# Patient Record
Sex: Male | Born: 1970 | Race: White | Hispanic: No | Smoking: Current every day smoker
Health system: Southern US, Community
[De-identification: ages and names within clinical notes are randomized; demographics above are authoritative.]

## PROBLEM LIST (undated history)

## (undated) DIAGNOSIS — F329 Major depressive disorder, single episode, unspecified: Secondary | ICD-10-CM

## (undated) DIAGNOSIS — F419 Anxiety disorder, unspecified: Secondary | ICD-10-CM

## (undated) DIAGNOSIS — F1910 Other psychoactive substance abuse, uncomplicated: Secondary | ICD-10-CM

## (undated) DIAGNOSIS — F32A Depression, unspecified: Secondary | ICD-10-CM

## (undated) DIAGNOSIS — H9190 Unspecified hearing loss, unspecified ear: Secondary | ICD-10-CM

## (undated) HISTORY — PX: NOSE SURGERY: SHX723

## (undated) HISTORY — PX: ROTATOR CUFF REPAIR: SHX139

---
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
## 2019-02-18 ENCOUNTER — Ambulatory Visit (HOSPITAL_COMMUNITY)
Admission: EM | Admit: 2019-02-18 | Discharge: 2019-02-18 | Disposition: A | Discharge status: Home / Self Care | Payer: Medicare Other | Attending: Family Medicine | Admitting: Family Medicine

## 2019-02-18 ENCOUNTER — Encounter (HOSPITAL_COMMUNITY): Payer: Self-pay | Admitting: Family Medicine

## 2019-02-18 DIAGNOSIS — M25512 Pain in left shoulder: Secondary | ICD-10-CM | POA: Diagnosis not present

## 2019-02-18 DIAGNOSIS — M25551 Pain in right hip: Secondary | ICD-10-CM

## 2019-02-18 DIAGNOSIS — G8929 Other chronic pain: Secondary | ICD-10-CM

## 2019-02-18 MED ORDER — METHYLPREDNISOLONE ACETATE 40 MG/ML IJ SUSP
INTRAMUSCULAR | Status: AC
  Filled 2019-02-18: qty 2

## 2019-02-18 MED ORDER — METHYLPREDNISOLONE ACETATE 80 MG/ML IJ SUSP
40.0000 mg | Freq: Once | INTRAMUSCULAR | Status: AC
  Administered 2019-02-18: 40 mg via INTRA_ARTICULAR

## 2019-02-18 MED ORDER — METHYLPREDNISOLONE ACETATE 80 MG/ML IJ SUSP
40.0000 mg | Freq: Once | INTRAMUSCULAR | Status: AC
  Administered 2019-02-18: 40 mg via INTRAMUSCULAR

## 2019-02-18 NOTE — Discharge Instructions (Addendum)
If you need a shot in your right shoulder or right knee, you must wait a month

## 2019-02-18 NOTE — ED Provider Notes (Signed)
[facility]    CSN: [id number] Arrival date & time: 02/18/19  1421      History   Chief Complaint Chief Complaint  Patient presents with  . Back Pain  . Shoulder Pain  . Hip Pain    HPI Carl Skinner is a 48 y.o. male.   Initial [facility] visit for this 48 yo hearing impaired man with knee, shoulder and arm pain.  [place] registry shows patient has rec'd controlled substances from several cities in [place].  Patient periodically gets shots in his shoulders.  He would like a shot in each shoulder today as well as one in his hip.  I explained to him that we can only give him 2 shots today.  He would like to have a shot in his right hip and one in his left shoulder.     History reviewed. No pertinent past medical history.  There are no active problems to display for this patient.   Past Surgical History:  Procedure Laterality Date  . NOSE SURGERY         Home Medications    Prior to Admission medications   Medication Sig Start Date End Date Taking? Authorizing Provider  Dupilumab (DUPIXENT [place]) Inject into the skin.   Yes [provider]  QUEtiapine (SEROQUEL) 400 MG tablet Take 400 mg by mouth at bedtime.   Yes [provider]    Family History No family history on file.  Social History Social History   Tobacco Use  . Smoking status: Current Every Day Smoker  Substance Use Topics  . Alcohol use: Yes  . Drug use: Not on file     Allergies   Patient has no known allergies.   Review of Systems Review of Systems   Physical Exam Triage Vital Signs ED Triage Vitals  Enc Vitals Group     BP      Pulse      Resp      Temp      Temp src      SpO2      Weight      Height      Head Circumference      Peak Flow      Pain Score      Pain Loc      Pain Edu?      Excl. in [place]?    No data found.  Updated Vital Signs BP 122/70   Pulse 66   Temp 97.6 F (36.4 C)   Resp 18   SpO2 96%    Physical Exam Vitals signs  and nursing note reviewed. Exam conducted with a chaperone present.  Constitutional:      Appearance: Normal appearance. He is obese.  Eyes:     Conjunctiva/sclera: Conjunctivae normal.  Neck:     Musculoskeletal: Normal range of motion and neck supple.  Pulmonary:     Effort: Pulmonary effort is normal.  Musculoskeletal: Normal range of motion.        General: Tenderness and signs of injury present. No swelling or deformity.  Skin:    General: Skin is warm and dry.  Neurological:     General: No focal deficit present.     Mental Status: He is alert.  Psychiatric:        Mood and Affect: Mood normal.      UC Treatments / Results  Labs (all labs ordered are listed, but only abnormal results are displayed) Labs Reviewed - No data  to display  EKG   Radiology No results found.  Procedures Join Aspiration/Injection  Date/Time: 02/18/2019 3:15 PM Performed by: Elvina Sidle, MD Authorized by: Elvina Sidle, MD   Consent:    Consent obtained:  Verbal   Consent given by:  Patient   Risks discussed:  Pain   Alternatives discussed:  No treatment Location:    Location:  Shoulder   Shoulder:  L glenohumeral Anesthesia (see MAR for exact dosages):    Anesthesia method:  Topical application Procedure details:    Needle gauge:  22 G   Ultrasound guidance: no     Approach:  Posterior   Steroid injected: yes     Specimen collected: no   Post-procedure details:    Patient tolerance of procedure:  Tolerated well, no immediate complications   (including critical care time)  Medications Ordered in UC Medications  methylPREDNISolone acetate (DEPO-MEDROL) injection 40 mg (40 mg Intra-articular Given 02/18/19 1507)  methylPREDNISolone acetate (DEPO-MEDROL) injection 40 mg (40 mg Intramuscular Given 02/18/19 1507)  methylPREDNISolone acetate (DEPO-MEDROL) 40 MG/ML injection (has no administration in time range)    Initial Impression / Assessment and Plan / UC Course  I  have reviewed the triage vital signs and the nursing notes.  Pertinent labs & imaging results that were available during my care of the patient were reviewed by me and considered in my medical decision making (see chart for details).    Final Clinical Impressions(s) / UC Diagnoses   Final diagnoses:  Right hip pain  Chronic left shoulder pain     Discharge Instructions     If you need a shot in your right shoulder or right knee, you must wait a month      ED Prescriptions    None     I have reviewed the PDMP during this encounter.   Elvina Sidle, MD 02/18/19 [phone]
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
## 2019-04-30 ENCOUNTER — Ambulatory Visit (INDEPENDENT_AMBULATORY_CARE_PROVIDER_SITE_OTHER): Payer: Medicare Other

## 2019-04-30 ENCOUNTER — Ambulatory Visit (HOSPITAL_COMMUNITY)
Admission: EM | Admit: 2019-04-30 | Discharge: 2019-04-30 | Disposition: A | Discharge status: Home / Self Care | Payer: Medicare Other

## 2019-04-30 ENCOUNTER — Other Ambulatory Visit: Payer: Self-pay

## 2019-04-30 ENCOUNTER — Encounter (HOSPITAL_COMMUNITY): Payer: Self-pay

## 2019-04-30 DIAGNOSIS — M25512 Pain in left shoulder: Secondary | ICD-10-CM

## 2019-04-30 DIAGNOSIS — M25551 Pain in right hip: Secondary | ICD-10-CM | POA: Diagnosis not present

## 2019-04-30 MED ORDER — METHYLPREDNISOLONE ACETATE 80 MG/ML IJ SUSP
INTRAMUSCULAR | Status: AC
  Filled 2019-04-30: qty 1

## 2019-04-30 MED ORDER — METHYLPREDNISOLONE ACETATE 80 MG/ML IJ SUSP
80.0000 mg | Freq: Once | INTRAMUSCULAR | Status: AC
  Administered 2019-04-30: 80 mg via INTRAMUSCULAR

## 2019-04-30 MED ORDER — DICLOFENAC SODIUM 75 MG PO TBEC: 75.0000 mg | DELAYED_RELEASE_TABLET | Freq: Two times a day (BID) | ORAL | 0 refills | Status: DC
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
## 2019-06-14 ENCOUNTER — Other Ambulatory Visit: Payer: Self-pay

## 2019-06-14 ENCOUNTER — Encounter (HOSPITAL_BASED_OUTPATIENT_CLINIC_OR_DEPARTMENT_OTHER): Payer: Self-pay | Admitting: Orthopaedic Surgery

## 2019-06-17 ENCOUNTER — Other Ambulatory Visit (HOSPITAL_COMMUNITY)
Admission: RE | Admit: 2019-06-17 | Discharge: 2019-06-17 | Disposition: A | Discharge status: Home / Self Care | Payer: Medicare Other | Source: Ambulatory Visit | Attending: Orthopaedic Surgery | Admitting: Orthopaedic Surgery

## 2019-06-17 DIAGNOSIS — Z20822 Contact with and (suspected) exposure to covid-19: Secondary | ICD-10-CM | POA: Diagnosis not present

## 2019-06-17 DIAGNOSIS — Z01812 Encounter for preprocedural laboratory examination: Secondary | ICD-10-CM | POA: Insufficient documentation

## 2019-06-17 LAB — SARS CORONAVIRUS 2 (TAT 6-24 HRS): SARS Coronavirus 2: NEGATIVE

## 2019-06-18 NOTE — H&P (Addendum)
PREOPERATIVE H&P  Chief Complaint: LEFT SHOULDER OSTEOARTHRITIS, IMPINGEMENT SYNDROME, BICIPITAL TENDONITIS  HPI: Carl Skinner is a 49 y.o. male who is scheduled for LEFT SHOULDER ARTHROSCOPY WITH DEBRIDEMENT EXTENSIVE, DISTAL CLAVICULECTOMY, SUBACROMIAL DECOMPRESSION PARTIAL ACROMIOPLASTY WITH CORACOACROMIAL RELEASE AND BICEP TENODESIS.   Patient has a past medical history significant for hearing impairment.  He has had left shoulder pain that has been ongoing for years. He has tried and failed non-operative measures for quite some time, including injections, exercises and anti-inflammatories. He still has pain and frustrated by his left shoulder. He is left hand dominant.   His symptoms are rated as moderate to severe, and have been worsening.  This is significantly impairing activities of daily living.    Please see clinic note for further details on this patient's care.    He has elected for surgical management.   Past Medical History:  Diagnosis Date  . Anxiety   . Deaf   . Depression    Past Surgical History:  Procedure Laterality Date  . NOSE SURGERY     Social History   Socioeconomic History  . Marital status: Unknown    Spouse name: Not on file  . Number of children: Not on file  . Years of education: Not on file  . Highest education level: Not on file  Occupational History  . Not on file  Tobacco Use  . Smoking status: Current Every Day Smoker    Packs/day: 0.50    Types: Cigarettes  . Smokeless tobacco: Never Used  Substance and Sexual Activity  . Alcohol use: Yes  . Drug use: Never  . Sexual activity: Not on file  Other Topics Concern  . Not on file  Social History Narrative  . Not on file   Social Determinants of Health   Financial Resource Strain:   . Difficulty of Paying Living Expenses: Not on file  Food Insecurity:   . Worried About Programme researcher, broadcasting/film/video in the Last Year: Not on file  . Ran Out of Food in the Last Year: Not on file    Transportation Needs:   . Lack of Transportation (Medical): Not on file  . Lack of Transportation (Non-Medical): Not on file  Physical Activity:   . Days of Exercise per Week: Not on file  . Minutes of Exercise per Session: Not on file  Stress:   . Feeling of Stress : Not on file  Social Connections:   . Frequency of Communication with Friends and Family: Not on file  . Frequency of Social Gatherings with Friends and Family: Not on file  . Attends Religious Services: Not on file  . Active Member of Clubs or Organizations: Not on file  . Attends Banker Meetings: Not on file  . Marital Status: Not on file   Family History  Problem Relation Age of Onset  . Healthy Mother   . Healthy Father    Allergies  Allergen Reactions  . Shrimp [Shellfish Allergy] Nausea And Vomiting   Prior to Admission medications   Medication Sig Start Date End Date Taking? Authorizing Provider  DULoxetine (CYMBALTA) 20 MG capsule Take 20 mg by mouth daily.   Yes [provider]  diclofenac (VOLTAREN) 75 MG EC tablet Take 1 tablet (75 mg total) by mouth 2 (two) times daily. 04/30/19   Wallis Bamberg, PA-C    ROS: All other systems have been reviewed and were otherwise negative with the exception of those mentioned in the HPI and as  above.  Physical Exam: General: Alert, no acute distress Cardiovascular: No pedal edema Respiratory: No cyanosis, no use of accessory musculature GI: No organomegaly, abdomen is soft and non-tender Skin: No lesions in the area of chief complaint Neurologic: Sensation intact distally Psychiatric: Patient is competent for consent with normal mood and affect Lymphatic: No axillary or cervical lymphadenopathy  MUSCULOSKELETAL:  Left shoulder: Active forward elevation to 170, external rotation to 60. There is 4-/5 supraspinatus strength. Positive impingement. AC tenderness to palpation and O'Brien's.   Imaging: MRI which demonstrates significant  subacromial bursitis.  AC arthrosis with Type II acromion. There is fluid around the biceps and a possibility of a leading edge supraspinatus tear far anterior.   Assessment: LEFT SHOULDER OSTEOARTHRITIS, IMPINGEMENT SYNDROME, BICIPITAL TENDONITIS  Plan: Plan for Procedure(s): LEFT SHOULDER ARTHROSCOPY WITH DEBRIDEMENT EXTENSIVE, DISTAL CLAVICULECTOMY, SUBACROMIAL DECOMPRESSION PARTIAL ACROMIOPLASTY WITH CORACOACROMIAL RELEASE AND BICEP TENODESIS  The risks benefits and alternatives were discussed with the patient including but not limited to the risks of nonoperative treatment, versus surgical intervention including infection, bleeding, nerve injury,  blood clots, cardiopulmonary complications, morbidity, mortality, among others, and they were willing to proceed.   The patient acknowledged the explanation, agreed to proceed with the plan and consent was signed.   Operative Plan: Left shoulder scope with SAD, DCE, BT, and possible RCR Discharge Medications: Celebrex, Tylenol, Oxycodone, Zofran DVT Prophylaxis: None Physical Therapy: Outpatient PT Special Discharge needs: [place], PA-C  06/18/2019 7:04 AM

## 2019-06-20 ENCOUNTER — Ambulatory Visit (HOSPITAL_BASED_OUTPATIENT_CLINIC_OR_DEPARTMENT_OTHER): Payer: Medicare Other | Admitting: Anesthesiology

## 2019-06-20 ENCOUNTER — Encounter (HOSPITAL_BASED_OUTPATIENT_CLINIC_OR_DEPARTMENT_OTHER)
Admission: RE | Disposition: A | Discharge status: Home / Self Care | Payer: Self-pay | Source: Home / Self Care | Attending: Orthopaedic Surgery

## 2019-06-20 ENCOUNTER — Encounter (HOSPITAL_BASED_OUTPATIENT_CLINIC_OR_DEPARTMENT_OTHER): Payer: Self-pay | Admitting: Orthopaedic Surgery

## 2019-06-20 ENCOUNTER — Ambulatory Visit (HOSPITAL_BASED_OUTPATIENT_CLINIC_OR_DEPARTMENT_OTHER)
Admission: RE | Admit: 2019-06-20 | Discharge: 2019-06-20 | Disposition: A | Discharge status: Home / Self Care | Payer: Medicare Other | Attending: Orthopaedic Surgery | Admitting: Orthopaedic Surgery

## 2019-06-20 DIAGNOSIS — E669 Obesity, unspecified: Secondary | ICD-10-CM | POA: Insufficient documentation

## 2019-06-20 DIAGNOSIS — M7542 Impingement syndrome of left shoulder: Secondary | ICD-10-CM | POA: Diagnosis not present

## 2019-06-20 DIAGNOSIS — F329 Major depressive disorder, single episode, unspecified: Secondary | ICD-10-CM | POA: Diagnosis not present

## 2019-06-20 DIAGNOSIS — H9190 Unspecified hearing loss, unspecified ear: Secondary | ICD-10-CM | POA: Insufficient documentation

## 2019-06-20 DIAGNOSIS — Z6833 Body mass index (BMI) 33.0-33.9, adult: Secondary | ICD-10-CM | POA: Diagnosis not present

## 2019-06-20 DIAGNOSIS — F419 Anxiety disorder, unspecified: Secondary | ICD-10-CM | POA: Insufficient documentation

## 2019-06-20 DIAGNOSIS — M19012 Primary osteoarthritis, left shoulder: Secondary | ICD-10-CM | POA: Diagnosis present

## 2019-06-20 DIAGNOSIS — M7522 Bicipital tendinitis, left shoulder: Secondary | ICD-10-CM | POA: Insufficient documentation

## 2019-06-20 DIAGNOSIS — K219 Gastro-esophageal reflux disease without esophagitis: Secondary | ICD-10-CM | POA: Insufficient documentation

## 2019-06-20 DIAGNOSIS — F17210 Nicotine dependence, cigarettes, uncomplicated: Secondary | ICD-10-CM | POA: Insufficient documentation

## 2019-06-20 HISTORY — DX: Unspecified hearing loss, unspecified ear: H91.90

## 2019-06-20 HISTORY — PX: BICEPT TENODESIS: SHX5116

## 2019-06-20 HISTORY — DX: Anxiety disorder, unspecified: F41.9

## 2019-06-20 HISTORY — DX: Depression, unspecified: F32.A

## 2019-06-20 SURGERY — SHOULDER ARTHROSCOPY WITH SUBACROMIAL DECOMPRESSION AND DISTAL CLAVICLE EXCISION
Anesthesia: General | Site: Shoulder | Laterality: Left

## 2019-06-20 MED ORDER — DEXAMETHASONE SODIUM PHOSPHATE 10 MG/ML IJ SOLN
INTRAMUSCULAR | Status: DC | PRN
  Administered 2019-06-20: 10 mg via INTRAVENOUS

## 2019-06-20 MED ORDER — PROPOFOL 10 MG/ML IV BOLUS
INTRAVENOUS | Status: AC
  Filled 2019-06-20: qty 20

## 2019-06-20 MED ORDER — OXYCODONE HCL 5 MG PO TABS: ORAL_TABLET | ORAL | 0 refills | Status: AC

## 2019-06-20 MED ORDER — ONDANSETRON HCL 4 MG PO TABS: 4.0000 mg | ORAL_TABLET | Freq: Three times a day (TID) | ORAL | 1 refills | Status: AC | PRN

## 2019-06-20 MED ORDER — LACTATED RINGERS IV SOLN: INTRAVENOUS | Status: DC

## 2019-06-20 MED ORDER — FENTANYL CITRATE (PF) 100 MCG/2ML IJ SOLN
50.0000 ug | INTRAMUSCULAR | Status: DC | PRN
  Administered 2019-06-20: 50 ug via INTRAVENOUS

## 2019-06-20 MED ORDER — SODIUM CHLORIDE 0.9 % IR SOLN
Status: DC | PRN
  Administered 2019-06-20: 7500 mL

## 2019-06-20 MED ORDER — PROPOFOL 10 MG/ML IV BOLUS
INTRAVENOUS | Status: DC | PRN
  Administered 2019-06-20: 250 mg via INTRAVENOUS

## 2019-06-20 MED ORDER — CEFAZOLIN SODIUM-DEXTROSE 2-4 GM/100ML-% IV SOLN
INTRAVENOUS | Status: AC
  Filled 2019-06-20: qty 100

## 2019-06-20 MED ORDER — PHENYLEPHRINE HCL-NACL 10-0.9 MG/250ML-% IV SOLN
INTRAVENOUS | Status: DC | PRN
  Administered 2019-06-20: 25 ug/min via INTRAVENOUS

## 2019-06-20 MED ORDER — FENTANYL CITRATE (PF) 100 MCG/2ML IJ SOLN
INTRAMUSCULAR | Status: AC
  Filled 2019-06-20: qty 2

## 2019-06-20 MED ORDER — FENTANYL CITRATE (PF) 100 MCG/2ML IJ SOLN
25.0000 ug | INTRAMUSCULAR | Status: DC | PRN
  Administered 2019-06-20: 50 ug via INTRAVENOUS

## 2019-06-20 MED ORDER — ONDANSETRON HCL 4 MG/2ML IJ SOLN
INTRAMUSCULAR | Status: DC | PRN
  Administered 2019-06-20: 4 mg via INTRAVENOUS

## 2019-06-20 MED ORDER — CEFAZOLIN SODIUM-DEXTROSE 2-4 GM/100ML-% IV SOLN
2.0000 g | INTRAVENOUS | Status: AC
  Administered 2019-06-20: 2 g via INTRAVENOUS

## 2019-06-20 MED ORDER — DICLOFENAC SODIUM 75 MG PO TBEC: 75.0000 mg | DELAYED_RELEASE_TABLET | Freq: Two times a day (BID) | ORAL | 0 refills | Status: DC

## 2019-06-20 MED ORDER — LIDOCAINE 2% (20 MG/ML) 5 ML SYRINGE
INTRAMUSCULAR | Status: DC | PRN
  Administered 2019-06-20: 40 mg via INTRAVENOUS

## 2019-06-20 MED ORDER — ROPIVACAINE HCL 7.5 MG/ML IJ SOLN
INTRAMUSCULAR | Status: DC | PRN
  Administered 2019-06-20: 20 mL via PERINEURAL

## 2019-06-20 MED ORDER — ACETAMINOPHEN 500 MG PO TABS
1000.0000 mg | ORAL_TABLET | Freq: Once | ORAL | Status: AC
  Administered 2019-06-20: 1000 mg via ORAL

## 2019-06-20 MED ORDER — PHENYLEPHRINE 40 MCG/ML (10ML) SYRINGE FOR IV PUSH (FOR BLOOD PRESSURE SUPPORT)
PREFILLED_SYRINGE | INTRAVENOUS | Status: DC | PRN
  Administered 2019-06-20: 120 ug via INTRAVENOUS

## 2019-06-20 MED ORDER — CHLORHEXIDINE GLUCONATE 4 % EX LIQD: 60.0000 mL | Freq: Once | CUTANEOUS | Status: DC

## 2019-06-20 MED ORDER — MIDAZOLAM HCL 2 MG/2ML IJ SOLN
1.0000 mg | INTRAMUSCULAR | Status: DC | PRN
  Administered 2019-06-20: 2 mg via INTRAVENOUS

## 2019-06-20 MED ORDER — ACETAMINOPHEN 500 MG PO TABS: 1000.0000 mg | ORAL_TABLET | Freq: Three times a day (TID) | ORAL | 0 refills | Status: DC

## 2019-06-20 MED ORDER — ROCURONIUM BROMIDE 10 MG/ML (PF) SYRINGE
PREFILLED_SYRINGE | INTRAVENOUS | Status: DC | PRN
  Administered 2019-06-20: 50 mg via INTRAVENOUS

## 2019-06-20 MED ORDER — FENTANYL CITRATE (PF) 100 MCG/2ML IJ SOLN
INTRAMUSCULAR | Status: DC | PRN
  Administered 2019-06-20: 100 ug via INTRAVENOUS

## 2019-06-20 MED ORDER — SUGAMMADEX SODIUM 200 MG/2ML IV SOLN
INTRAVENOUS | Status: DC | PRN
  Administered 2019-06-20: 200 mg via INTRAVENOUS

## 2019-06-20 MED ORDER — ACETAMINOPHEN 500 MG PO TABS
ORAL_TABLET | ORAL | Status: AC
  Filled 2019-06-20: qty 2

## 2019-06-20 MED ORDER — MIDAZOLAM HCL 2 MG/2ML IJ SOLN
INTRAMUSCULAR | Status: AC
  Filled 2019-06-20: qty 2

## 2019-06-20 SURGICAL SUPPLY — 68 items
ANCHOR SUT 1.8 FBRTK KNTLS 2SU (Anchor) ×6 IMPLANT
BLADE EXCALIBUR 4.0MM X 13CM (MISCELLANEOUS) ×1
BLADE EXCALIBUR 4.0X13 (MISCELLANEOUS) ×2 IMPLANT
BURR OVAL 8 FLU 4.0MM X 13CM (MISCELLANEOUS) ×1
BURR OVAL 8 FLU 4.0X13 (MISCELLANEOUS) ×2 IMPLANT
CANNULA 5.75X71 LONG (CANNULA) IMPLANT
CANNULA PASSPORT 5 (CANNULA) IMPLANT
CANNULA PASSPORT 5CM (CANNULA)
CANNULA PASSPORT BUTTON 10-40 (CANNULA) ×3 IMPLANT
CANNULA TWIST IN 8.25X7CM (CANNULA) IMPLANT
CHLORAPREP W/TINT 26 (MISCELLANEOUS) ×3 IMPLANT
CLOSURE STERI-STRIP 1/2X4 (GAUZE/BANDAGES/DRESSINGS) ×1
CLSR STERI-STRIP ANTIMIC 1/2X4 (GAUZE/BANDAGES/DRESSINGS) ×2 IMPLANT
COVER WAND RF STERILE (DRAPES) IMPLANT
DECANTER SPIKE VIAL GLASS SM (MISCELLANEOUS) IMPLANT
DISSECTOR 3.5MM X 13CM CVD (MISCELLANEOUS) IMPLANT
DISSECTOR 4.0MMX13CM CVD (MISCELLANEOUS) IMPLANT
DRAPE IMP U-DRAPE 54X76 (DRAPES) ×3 IMPLANT
DRAPE INCISE IOBAN 66X45 STRL (DRAPES) IMPLANT
DRAPE SHOULDER BEACH CHAIR (DRAPES) ×3 IMPLANT
DRSG PAD ABDOMINAL 8X10 ST (GAUZE/BANDAGES/DRESSINGS) ×3 IMPLANT
DW OUTFLOW CASSETTE/TUBE SET (MISCELLANEOUS) ×3 IMPLANT
GAUZE SPONGE 4X4 12PLY STRL (GAUZE/BANDAGES/DRESSINGS) ×3 IMPLANT
GAUZE XEROFORM 1X8 LF (GAUZE/BANDAGES/DRESSINGS) IMPLANT
GLOVE BIO SURGEON STRL SZ 6.5 (GLOVE) ×2 IMPLANT
GLOVE BIO SURGEONS STRL SZ 6.5 (GLOVE) ×1
GLOVE BIOGEL PI IND STRL 6.5 (GLOVE) ×1 IMPLANT
GLOVE BIOGEL PI IND STRL 7.0 (GLOVE) ×2 IMPLANT
GLOVE BIOGEL PI IND STRL 8 (GLOVE) ×1 IMPLANT
GLOVE BIOGEL PI INDICATOR 6.5 (GLOVE) ×2
GLOVE BIOGEL PI INDICATOR 7.0 (GLOVE) ×4
GLOVE BIOGEL PI INDICATOR 8 (GLOVE) ×2
GLOVE ECLIPSE 6.5 STRL STRAW (GLOVE) ×6 IMPLANT
GLOVE ECLIPSE 8.0 STRL XLNG CF (GLOVE) ×3 IMPLANT
GOWN STRL REUS W/ TWL LRG LVL3 (GOWN DISPOSABLE) ×1 IMPLANT
GOWN STRL REUS W/TWL LRG LVL3 (GOWN DISPOSABLE) ×2
GOWN STRL REUS W/TWL XL LVL3 (GOWN DISPOSABLE) ×3 IMPLANT
KIT STABILIZATION SHOULDER (MISCELLANEOUS) ×3 IMPLANT
KIT STR SPEAR 1.8 FBRTK DISP (KITS) ×3 IMPLANT
LASSO 90 CVE QUICKPAS (DISPOSABLE) ×3 IMPLANT
LASSO CRESCENT QUICKPASS (SUTURE) IMPLANT
MANIFOLD NEPTUNE II (INSTRUMENTS) ×3 IMPLANT
NDL SAFETY ECLIPSE 18X1.5 (NEEDLE) ×1 IMPLANT
NEEDLE HYPO 18GX1.5 SHARP (NEEDLE) ×2
NEEDLE SCORPION MULTI FIRE (NEEDLE) IMPLANT
PACK ARTHROSCOPY DSU (CUSTOM PROCEDURE TRAY) ×3 IMPLANT
PACK BASIN DAY SURGERY FS (CUSTOM PROCEDURE TRAY) ×3 IMPLANT
PAD ORTHO SHOULDER 7X19 LRG (SOFTGOODS) ×3 IMPLANT
PORT APPOLLO RF 90DEGREE MULTI (SURGICAL WAND) ×3 IMPLANT
RESTRAINT HEAD UNIVERSAL NS (MISCELLANEOUS) ×3 IMPLANT
SHEET MEDIUM DRAPE 40X70 STRL (DRAPES) ×3 IMPLANT
SLEEVE SCD COMPRESS KNEE MED (MISCELLANEOUS) ×3 IMPLANT
SLING ARM FOAM STRAP LRG (SOFTGOODS) IMPLANT
SPONGE LAP 4X18 RFD (DISPOSABLE) IMPLANT
SUT FIBERWIRE #2 38 T-5 BLUE (SUTURE)
SUT MNCRL AB 4-0 PS2 18 (SUTURE) ×3 IMPLANT
SUT PDS AB 1 CT  36 (SUTURE) ×2
SUT PDS AB 1 CT 36 (SUTURE) ×1 IMPLANT
SUT TIGER TAPE 7 IN WHITE (SUTURE) IMPLANT
SUTURE FIBERWR #2 38 T-5 BLUE (SUTURE) IMPLANT
SUTURE TAPE TIGERLINK 1.3MM BL (SUTURE) IMPLANT
SUTURETAPE TIGERLINK 1.3MM BL (SUTURE)
SYR 5ML LL (SYRINGE) ×3 IMPLANT
TAPE FIBER 2MM 7IN #2 BLUE (SUTURE) IMPLANT
TOWEL GREEN STERILE FF (TOWEL DISPOSABLE) ×6 IMPLANT
TUBE CONNECTING 20'X1/4 (TUBING) ×1
TUBE CONNECTING 20X1/4 (TUBING) ×2 IMPLANT
TUBING ARTHROSCOPY IRRIG 16FT (MISCELLANEOUS) ×3 IMPLANT

## 2019-06-20 NOTE — Anesthesia Postprocedure Evaluation (Signed)
Anesthesia Post Note  Patient: Akshaj Besancon  Procedure(s) Performed: LEFT SHOULDER ARTHROSCOPY WITH DEBRIDEMENT EXTENSIVE, DISTAL CLAVICULECTOMY, SUBACROMIAL DECOMPRESSION PARTIAL ACROMIOPLASTY WITH CORACOACROMIAL RELEASE AND BICEP TENODESIS (Left Shoulder) BICEPS TENODESIS (Left Shoulder)     Patient location during evaluation: PACU Anesthesia Type: General Level of consciousness: awake and alert Pain management: pain level controlled Vital Signs Assessment: post-procedure vital signs reviewed and stable Respiratory status: spontaneous breathing, nonlabored ventilation and respiratory function stable Cardiovascular status: blood pressure returned to baseline and stable Postop Assessment: no apparent nausea or vomiting Anesthetic complications: no    Last Vitals:  Vitals:   06/20/19 0915 06/20/19 0930  BP: 113/80   Pulse: 68 71  Resp: 16 (!) 22  Temp:    SpO2: 97% 96%    Last Pain:  Vitals:   06/20/19 0928  TempSrc:   PainSc: 0-No pain                 Beryle Lathe
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
## 2019-06-20 NOTE — Anesthesia Procedure Notes (Signed)
Anesthesia Regional Block: Interscalene brachial plexus block   Pre-Anesthetic Checklist: ,, timeout performed, Correct Patient, Correct Site, Correct Laterality, Correct Procedure, Correct Position, site marked, Risks and benefits discussed,  Surgical consent,  Pre-op evaluation,  At surgeon's request and post-op pain management  Laterality: Left  Prep: chloraprep       Needles:  Injection technique: Single-shot  Needle Type: Echogenic Needle     Needle Length: 5cm  Needle Gauge: 21     Additional Needles:   Narrative:  Start time: 06/20/2019 7:14 AM End time: 06/20/2019 7:17 AM Injection made incrementally with aspirations every 5 mL.  Performed by: Personally  Anesthesiologist: Beryle Lathe, MD  Additional Notes: No pain on injection. No increased resistance to injection. Injection made in 5cc increments. Good needle visualization. Patient tolerated the procedure well.

## 2019-06-20 NOTE — Progress Notes (Signed)
AssistedDr. Brock with left, ultrasound guided, interscalene  block. Side rails up, monitors on throughout procedure. See vital signs in flow sheet. Tolerated Procedure well.  

## 2019-06-20 NOTE — Transfer of Care (Signed)
Immediate Anesthesia Transfer of Care Note  Patient: Carl Skinner  Procedure(s) Performed: LEFT SHOULDER ARTHROSCOPY WITH DEBRIDEMENT EXTENSIVE, DISTAL CLAVICULECTOMY, SUBACROMIAL DECOMPRESSION PARTIAL ACROMIOPLASTY WITH CORACOACROMIAL RELEASE AND BICEP TENODESIS (Left Shoulder) BICEPS TENODESIS (Left Shoulder)  Patient Location: PACU  Anesthesia Type:GA combined with regional for post-op pain  Level of Consciousness: drowsy  Airway & Oxygen Therapy: Patient Spontanous Breathing and Patient connected to nasal cannula oxygen  Post-op Assessment: Report given to RN and Post -op Vital signs reviewed and stable  Post vital signs: Reviewed and stable  Last Vitals:  Vitals Value Taken Time  BP 132/91 06/20/19 0853  Temp    Pulse 82 06/20/19 0854  Resp 39 06/20/19 0854  SpO2 97 % 06/20/19 0854  Vitals shown include unvalidated device data.  Last Pain:  Vitals:   06/20/19 0639  TempSrc: Temporal  PainSc: 5          Complications: No apparent anesthesia complications

## 2019-06-20 NOTE — Addendum Note (Signed)
Addendum  created 06/20/19 0953 by Elliot Dally, CRNA   Charge Capture section accepted

## 2019-06-20 NOTE — Op Note (Signed)
Orthopaedic Surgery Operative Note (CSN: [id number])  Carl Skinner  29-Dec-1970 Date of Surgery: 06/20/2019   Diagnoses:  Left shoulder AC arthrosis, impingement and biceps tendinitis  Procedure: Arthroscopic extensive debridement Arthroscopic subacromial decompression Arthroscopic biceps tenodesis Arthroscopic distal clavicle excision   Operative Finding Exam under anesthesia: Full motion no limitation Articular space: No loose bodies, capsule intact, anterior labral fraying noted  Chondral surfaces:Intact, no sign of chondral degeneration on the glenoid or humeral head Biceps: Significant inflammation in the tendon itself though the anchor looked slightly unstable as well. Subscapularis: Normal Superior Cuff: Normal Bursal side: Type II acromion converted to type I  Successful completion of the planned procedure.  Good fixation of the biceps with penetrating sutures to avoid pull-through.   Post-operative plan: The patient will be non-weightbearing in a sling for 4 weeks with therapy to start after first visit.  The patient will be discharged home.  DVT prophylaxis not indicated in ambulatory upper extremity patient without known risk factors.   Pain control with PRN pain medication preferring oral medicines.  Follow up plan will be scheduled in approximately 7 days for incision check and XR.  Post-Op Diagnosis: Same Surgeons:Primary: Hiram Gash, MD Assistants:Caroline McBane PA-C Location: [facility] OR ROOM 6 Anesthesia: General with Exparel interscalene block Antibiotics: Ancef 2 g Tourniquet time: None Estimated Blood Loss: Minimal Complications: None Specimens: None Implants: Implant Name Type Inv. Item Serial No. Manufacturer Lot No. LRB No. Used Action  ANCHOR SUT 1.8 FIBERTAK 2 SUT - [id number] Anchor ANCHOR SUT 1.8 FIBERTAK 2 SUT  [employer] [id number] Left 1 Implanted  ANCHOR SUT 1.8 FIBERTAK 2 SUT - [id number] Anchor ANCHOR SUT 1.8 FIBERTAK 2 SUT  [employer] [id number]  Left 1 Implanted    Indications for Surgery:   Carl Skinner is a 49 y.o. male with continued shoulder pain refractory to nonoperative measures for extended period of time.  The risks and benefits were explained at length including but not limited to continued pain, cuff failure, biceps tenodesis failure, stiffness, need for further surgery and infection.   Procedure:   Patient was correctly identified in the preoperative holding area and operative site marked.  Patient brought to OR and positioned beachchair on an Harts table ensuring that all bony prominences were padded and the head was in an appropriate location.  Anesthesia was induced and the operative shoulder was prepped and draped in the usual sterile fashion.  Timeout was called preincision.  A standard posterior viewing portal was made after localizing the portal with a spinal needle.  An anterior accessory portal was also made.  After clearing the articular space the camera was positioned in the subacromial space.  Findings above.    Extensive debridement was performed of the anterior interval tissue, labral fraying and the bursa.  Subacromial decompression: We made a lateral portal with spinal needle guidance. We then proceeded to debride bursal tissue extensively with a shaver and arthrocare device. At that point we continued to identify the borders of the acromion and identify the spur. We then carefully preserved the deltoid fascia and used a burr to convert the acromion to a Type 1 flat acromion without issue.  Biceps tenodesis: We marked the tendon and then performed a tenotomy and debridement of the stump in the articular space. We then identified the biceps tendon in its groove suprapec with the arthroscope in the lateral portal taking care to move from lateral to medial to avoid injury to the subscapularis. At that point we unroofed the  tendon itself and mobilized it. An accessory anterior portal was made in line with the tendon  and we grasped it from the anterior superior portal and worked from the accessory anterior portal. Two Fibertak 1.61mm knotless anchors were placed in the groove and the tendon was secured in a luggage loop style fashion with a pass of the limb of suture through the tendon using a scorpion device to avoid pull-through.  Repair was completed with good tension on the tendon.  Residual stump of the tendon was removed after being resected with a RF ablator.  Distal Clavicle resection:  The scope was placed in the subacromial space from the posterior portal.  A hemostat was placed through the anterior portal and we spread at the [facility] joint.  A burr was then inserted and 10 mm of distal clavicle was resected taking care to avoid damage to the capsule around the joint and avoiding overhanging bone posteriorly.      The incisions were closed with absorbable monocryl and steri strips.  A sterile dressing was placed along with a sling. The patient was awoken from general anesthesia and taken to the PACU in stable condition without complication.   Alfonse Alpers, PA-C, present and scrubbed throughout the case, critical for completion in a timely fashion, and for retraction, instrumentation, closure.

## 2019-06-20 NOTE — Interval H&P Note (Signed)
History and Physical Interval Note:  06/20/2019 7:22 AM  Carl Skinner  has presented today for surgery, with the diagnosis of LEFT SHOULDER OSTEOARTHRITIS, IMPINGEMENT SYNDROME, BICIPITAL TENDONITIS.  The various methods of treatment have been discussed with the patient and family. After consideration of risks, benefits and other options for treatment, the patient has consented to  Procedure(s): LEFT SHOULDER ARTHROSCOPY WITH DEBRIDEMENT EXTENSIVE, DISTAL CLAVICULECTOMY, SUBACROMIAL DECOMPRESSION PARTIAL ACROMIOPLASTY WITH CORACOACROMIAL RELEASE AND BICEP TENODESIS (Left) as a surgical intervention.  The patient's history has been reviewed, patient examined, no change in status, stable for surgery.  I have reviewed the patient's chart and labs.  Questions were answered to the patient's satisfaction.     Bjorn Pippin

## 2019-06-20 NOTE — Anesthesia Procedure Notes (Signed)
Procedure Name: Intubation Date/Time: 06/20/2019 7:37 AM Performed by: Elliot Dally, CRNA Pre-anesthesia Checklist: Patient identified, Emergency Drugs available, Suction available and Patient being monitored Patient Re-evaluated:Patient Re-evaluated prior to induction Oxygen Delivery Method: Circle System Utilized Preoxygenation: Pre-oxygenation with 100% oxygen Induction Type: IV induction Ventilation: Oral airway inserted - appropriate to patient size and Two handed mask ventilation required Laryngoscope Size: Miller and 3 Grade View: Grade I Tube type: Oral Tube size: 7.5 mm Number of attempts: 1 Airway Equipment and Method: Stylet and Oral airway Placement Confirmation: ETT inserted through vocal cords under direct vision,  positive ETCO2 and breath sounds checked- equal and bilateral Secured at: 22 cm Tube secured with: Tape Dental Injury: Teeth and Oropharynx as per pre-operative assessment

## 2019-06-20 NOTE — Discharge Instructions (Signed)
No Tylenol before 1:05pm today.  Post Anesthesia Home Care Instructions  Activity: Get plenty of rest for the remainder of the day. A responsible individual must stay with you for 24 hours following the procedure.  For the next 24 hours, DO NOT: -Drive a car -Advertising copywriter -Drink alcoholic beverages -Take any medication unless instructed by your physician -Make any legal decisions or sign important papers.  Meals: Start with liquid foods such as gelatin or soup. Progress to regular foods as tolerated. Avoid greasy, spicy, heavy foods. If nausea and/or vomiting occur, drink only clear liquids until the nausea and/or vomiting subsides. Call your physician if vomiting continues.  Special Instructions/Symptoms: Your throat may feel dry or sore from the anesthesia or the breathing tube placed in your throat during surgery. If this causes discomfort, gargle with warm salt water. The discomfort should disappear within 24 hours.  If you had a scopolamine patch placed behind your ear for the management of post- operative nausea and/or vomiting:  1. The medication in the patch is effective for 72 hours, after which it should be removed.  Wrap patch in a tissue and discard in the trash. Wash hands thoroughly with soap and water. 2. You may remove the patch earlier than 72 hours if you experience unpleasant side effects which may include dry mouth, dizziness or visual disturbances. 3. Avoid touching the patch. Wash your hands with soap and water after contact with the patch.    Regional Anesthesia Blocks  1. Numbness or the inability to move the "blocked" extremity may last from 3-48 hours after placement. The length of time depends on the medication injected and your individual response to the medication. If the numbness is not going away after 48 hours, call your surgeon.  2. The extremity that is blocked will need to be protected until the numbness is gone and the  Strength has returned.  Because you cannot feel it, you will need to take extra care to avoid injury. Because it may be weak, you may have difficulty moving it or using it. You may not know what position it is in without looking at it while the block is in effect.  3. For blocks in the legs and feet, returning to weight bearing and walking needs to be done carefully. You will need to wait until the numbness is entirely gone and the strength has returned. You should be able to move your leg and foot normally before you try and bear weight or walk. You will need someone to be with you when you first try to ensure you do not fall and possibly risk injury.  4. Bruising and tenderness at the needle site are common side effects and will resolve in a few days.  5. Persistent numbness or new problems with movement should be communicated to the surgeon or the [facility] [phone] [facility] [phone]).

## 2019-06-24 MED ORDER — HYDROCODONE-ACETAMINOPHEN 10-325 MG PO TABS
1.00 | ORAL_TABLET | ORAL | Status: DC
Start: ? — End: 2019-06-24

## 2019-07-03 ENCOUNTER — Other Ambulatory Visit: Payer: Self-pay

## 2019-07-03 ENCOUNTER — Ambulatory Visit (HOSPITAL_COMMUNITY)
Admission: EM | Admit: 2019-07-03 | Discharge: 2019-07-03 | Disposition: A | Discharge status: Home / Self Care | Payer: Medicare Other | Attending: Family Medicine | Admitting: Family Medicine

## 2019-07-03 ENCOUNTER — Encounter (HOSPITAL_COMMUNITY): Payer: Self-pay

## 2019-07-03 DIAGNOSIS — M25519 Pain in unspecified shoulder: Secondary | ICD-10-CM | POA: Diagnosis not present

## 2019-07-03 DIAGNOSIS — G8918 Other acute postprocedural pain: Secondary | ICD-10-CM

## 2019-07-03 MED ORDER — ACETAMINOPHEN 500 MG PO TABS: 1000.0000 mg | ORAL_TABLET | Freq: Three times a day (TID) | ORAL | 0 refills | Status: AC

## 2019-07-03 MED ORDER — HYDROCODONE-ACETAMINOPHEN 5-325 MG PO TABS: 1.0000 | ORAL_TABLET | Freq: Four times a day (QID) | ORAL | 0 refills | Status: DC | PRN

## 2019-07-03 NOTE — ED Notes (Signed)
Sign language interpreter asked permission to call patient's mother for him.  States technology is available, just needed to get permission from staff.  Permission granted.  Patient wants to call mother.

## 2019-07-03 NOTE — Discharge Instructions (Signed)
Please follow up with your surgeon for recheck and pain management as needed.  I do recommend establishing with a primary care provider for long term management of your blood thinning medication. Continue as previously prescribed.  Ace application, especially at the end of the day or after increased use.  Hydrocodone for breakthrough pain, otherwise use of tylenol scheduled to help with pain. May use Colace, over the counter, to help soften stools and prevent constipation.

## 2019-07-03 NOTE — ED Provider Notes (Signed)
[facility]    CSN: [id number] Arrival date & time: 07/03/19  1217      History   Chief Complaint Chief Complaint  Patient presents with  . refill on pain meds    HPI Carl Skinner is a 49 y.o. male.   Carl Skinner presents with complaints of left shoulder pain s/p left shoulder arthroscopy with Dr. Everardo Pacific on 3/4. He ended on with a hospital admission with left subclavian DVT and concern for cellulitis. Was started on eliquis and bactrim. Discharged 3/13. Ran out of his hydrocodone 2 days ago and states he still has a lot of pain with movement of left shoulder. No new numbness or tingling. Pain has generally improved, no worsening, but has not resolved. No new redness swelling or warmth. Doesn't have a follow up with ortho or with PCP set up. Doesn't have a PCP per patient.   ASL video interpreter used to collect history and physical exam.    ROS per HPI, negative if not otherwise mentioned.      Past Medical History:  Diagnosis Date  . Anxiety   . Deaf   . Depression     There are no problems to display for this patient.   Past Surgical History:  Procedure Laterality Date  . BICEPT TENODESIS Left 06/20/2019   Procedure: BICEPS TENODESIS;  Surgeon: Bjorn Pippin, MD;  Location: [facility];  Service: Orthopedics;  Laterality: Left;  . NOSE SURGERY    . ROTATOR CUFF REPAIR Left        Home Medications    Prior to Admission medications   Medication Sig Start Date End Date Taking? Authorizing Provider  apixaban (ELIQUIS) 5 MG TABS tablet Take 5 mg by mouth. 06/29/19  Yes [provider]  sulfamethoxazole-trimethoprim (BACTRIM DS) 800-160 MG tablet Take 160 tablets by mouth. 06/29/19 07/06/19 Yes [provider]  sulfamethoxazole-trimethoprim (BACTRIM DS) 800-160 MG tablet Take 1 tablet by mouth 2 (two) times daily.   Yes [provider]  acetaminophen (TYLENOL) 500 MG tablet Take 2 tablets (1,000 mg total) by  mouth every 8 (eight) hours for 14 days. 07/03/19 07/17/19  Carl Haber, NP  diclofenac (VOLTAREN) 75 MG EC tablet Take 1 tablet (75 mg total) by mouth 2 (two) times daily. 06/20/19   McBane, Jerald Kief, PA-C  DULoxetine (CYMBALTA) 20 MG capsule Take 20 mg by mouth daily.    [provider]  HYDROcodone-acetaminophen (NORCO/VICODIN) 5-325 MG tablet Take 1 tablet by mouth every 6 (six) hours as needed for moderate pain or severe pain. 07/03/19   Carl Haber, NP    Family History Family History  Problem Relation Age of Onset  . Healthy Mother   . Healthy Father     Social History Social History   Tobacco Use  . Smoking status: Current Every Day Smoker    Packs/day: 0.50    Types: Cigarettes  . Smokeless tobacco: Never Used  Substance Use Topics  . Alcohol use: Not Currently  . Drug use: Never     Allergies   Shrimp [shellfish allergy]   Review of Systems Review of Systems   Physical Exam Triage Vital Signs ED Triage Vitals  Enc Vitals Group     BP 07/03/19 1300 140/82     Pulse Rate 07/03/19 1300 66     Resp 07/03/19 1300 18     Temp 07/03/19 1300 97.7 F (36.5 C)     Temp Source 07/03/19 1300 Oral  SpO2 07/03/19 1300 96 %     Weight 07/03/19 1301 220 lb (99.8 kg)     Height 07/03/19 1301 5\' 10"  (1.778 m)     Head Circumference --      Peak Flow --      Pain Score 07/03/19 1300 10     Pain Loc --      Pain Edu? --      Excl. in GC? --    No data found.  Updated Vital Signs BP 140/82   Pulse 66   Temp 97.7 F (36.5 C) (Oral)   Resp 18   Ht 5\' 10"  (1.778 m)   Wt 220 lb (99.8 kg)   SpO2 96%   BMI 31.57 kg/m   Visual Acuity Right Eye Distance:   Left Eye Distance:   Bilateral Distance:    Right Eye Near:   Left Eye Near:    Bilateral Near:     Physical Exam Constitutional:      Appearance: He is well-developed.  Cardiovascular:     Rate and Rhythm: Normal rate.  Pulmonary:     Effort: Pulmonary effort is normal.    Musculoskeletal:     Comments: Well healing arthroscopy incisions to left proximal shoulder x2 noted without redness swelling or warmth; left bicep without significant redness or warmth; palpable radial pulse; patient is moving arm quite extensively, especially with signing, without noted guarding; cap refill < 2 seconds    Skin:    General: Skin is warm and dry.  Neurological:     Mental Status: He is alert and oriented to person, place, and time.      UC Treatments / Results  Labs (all labs ordered are listed, but only abnormal results are displayed) Labs Reviewed - No data to display  EKG   Radiology No results found.  Procedures Procedures (including critical care time)  Medications Ordered in UC Medications - No data to display  Initial Impression / Assessment and Plan / UC Course  I have reviewed the triage vital signs and the nursing notes.  Pertinent labs & imaging results that were available during my care of the patient were reviewed by me and considered in my medical decision making (see chart for details).     No worsening of pain, no new redness swelling or warmth. No obvious cellulitis present, still on bactrim and eliquis. Due to eliquis did not provide additional nsaids at this time, tylenol recommended at scheduled intervals. Additional 10 tabs of hydrocodone 5mg  provided, discussed with patient that he should be weaning off of these at this time, and to follow with his surgeon and/or PCP for recheck as needed and for management of his blood thinner. Return precautions provided. Patient verbalized understanding and agreeable to plan.   Final Clinical Impressions(s) / UC Diagnoses   Final diagnoses:  Postoperative pain, acute, shoulder     Discharge Instructions     Please follow up with your surgeon for recheck and pain management as needed.  I do recommend establishing with a primary care provider for long term management of your blood thinning  medication. Continue as previously prescribed.  Ace application, especially at the end of the day or after increased use.  Hydrocodone for breakthrough pain, otherwise use of tylenol scheduled to help with pain. May use Colace, over the counter, to help soften stools and prevent constipation.    ED Prescriptions    Medication Sig Dispense Auth. Provider   HYDROcodone-acetaminophen (NORCO/VICODIN) 5-325 MG tablet  Take 1 tablet by mouth every 6 (six) hours as needed for moderate pain or severe pain. 10 tablet Augusto Gamble B, NP   acetaminophen (TYLENOL) 500 MG tablet Take 2 tablets (1,000 mg total) by mouth every 8 (eight) hours for 14 days. 84 tablet Augusto Gamble B, NP     I have reviewed the PDMP during this encounter.   Zigmund Gottron, NP 07/03/19 1354

## 2019-07-03 NOTE — ED Triage Notes (Signed)
Pt states he had surgery for rotator cuff on left shoulder on 3/12 and he states he has 10/10 left shoulder pain still and wants a refill on pain meds. He said he is out of pain meds. Pt able to move extremity. Pt has 2+ left radial pulse, 5/5 bilat grip strength, limited ROM of left shoulder., extremity warm to touch, cap refill less than 3 sec.

## 2019-11-20 ENCOUNTER — Other Ambulatory Visit: Payer: Self-pay

## 2019-11-20 ENCOUNTER — Emergency Department (HOSPITAL_COMMUNITY)
Admission: EM | Admit: 2019-11-20 | Discharge: 2019-11-20 | Disposition: A | Discharge status: Home / Self Care | Payer: Medicare Other | Attending: Emergency Medicine | Admitting: Emergency Medicine

## 2019-11-20 ENCOUNTER — Encounter (HOSPITAL_COMMUNITY): Payer: Self-pay | Admitting: Emergency Medicine

## 2019-11-20 ENCOUNTER — Emergency Department (HOSPITAL_COMMUNITY): Payer: Medicare Other

## 2019-11-20 DIAGNOSIS — F1510 Other stimulant abuse, uncomplicated: Secondary | ICD-10-CM | POA: Diagnosis not present

## 2019-11-20 DIAGNOSIS — R112 Nausea with vomiting, unspecified: Secondary | ICD-10-CM | POA: Diagnosis not present

## 2019-11-20 DIAGNOSIS — M79672 Pain in left foot: Secondary | ICD-10-CM | POA: Diagnosis not present

## 2019-11-20 DIAGNOSIS — Z7901 Long term (current) use of anticoagulants: Secondary | ICD-10-CM | POA: Diagnosis not present

## 2019-11-20 DIAGNOSIS — Z79899 Other long term (current) drug therapy: Secondary | ICD-10-CM | POA: Diagnosis not present

## 2019-11-20 DIAGNOSIS — F17210 Nicotine dependence, cigarettes, uncomplicated: Secondary | ICD-10-CM | POA: Insufficient documentation

## 2019-11-20 LAB — LIPASE, BLOOD: Lipase: 23 U/L (ref 11–51)

## 2019-11-20 LAB — COMPREHENSIVE METABOLIC PANEL
ALT: 34 U/L (ref 0–44)
AST: 56 U/L — ABNORMAL HIGH (ref 15–41)
Albumin: 4.1 g/dL (ref 3.5–5.0)
Alkaline Phosphatase: 78 U/L (ref 38–126)
Anion gap: 9 (ref 5–15)
BUN: 32 mg/dL — ABNORMAL HIGH (ref 6–20)
CO2: 31 mmol/L (ref 22–32)
Calcium: 9 mg/dL (ref 8.9–10.3)
Chloride: 91 mmol/L — ABNORMAL LOW (ref 98–111)
Creatinine, Ser: 1.09 mg/dL (ref 0.61–1.24)
GFR calc Af Amer: >60 mL/min (ref >60)
GFR calc non Af Amer: >60 mL/min (ref >60)
Glucose, Bld: 101 mg/dL — ABNORMAL HIGH (ref 70–99)
Potassium: 3.5 mmol/L (ref 3.5–5.1)
Sodium: 131 mmol/L — ABNORMAL LOW (ref 135–145)
Total Bilirubin: 1.2 mg/dL (ref 0.3–1.2)
Total Protein: 7.3 g/dL (ref 6.5–8.1)

## 2019-11-20 LAB — RAPID URINE DRUG SCREEN, HOSP PERFORMED
Amphetamines: POSITIVE — AB
Barbiturates: NOT DETECTED
Benzodiazepines: NOT DETECTED
Cocaine: POSITIVE — AB
Opiates: NOT DETECTED
Tetrahydrocannabinol: NOT DETECTED

## 2019-11-20 LAB — URINALYSIS, ROUTINE W REFLEX MICROSCOPIC
Bilirubin Urine: NEGATIVE
Glucose, UA: NEGATIVE mg/dL
Hgb urine dipstick: NEGATIVE
Ketones, ur: NEGATIVE mg/dL
Leukocytes,Ua: NEGATIVE
Nitrite: NEGATIVE
Protein, ur: NEGATIVE mg/dL
Specific Gravity, Urine: 1.014 (ref 1.005–1.030)
pH: 5 (ref 5.0–8.0)

## 2019-11-20 LAB — CBC
HCT: 44.3 % (ref 39.0–52.0)
Hemoglobin: 14.8 g/dL (ref 13.0–17.0)
MCH: 29.1 pg (ref 26.0–34.0)
MCHC: 33.4 g/dL (ref 30.0–36.0)
MCV: 87 fL (ref 80.0–100.0)
Platelets: 228 10*3/uL (ref 150–400)
RBC: 5.09 MIL/uL (ref 4.22–5.81)
RDW: 13.2 % (ref 11.5–15.5)
WBC: 7 10*3/uL (ref 4.0–10.5)
nRBC: 0 % (ref 0.0–0.2)

## 2019-11-20 LAB — LACTIC ACID, PLASMA
Lactic Acid, Venous: 4 mmol/L (ref 0.5–1.9)
Lactic Acid, Venous: 2.6 mmol/L (ref 0.5–1.9)

## 2019-11-20 MED ORDER — ONDANSETRON HCL 4 MG/2ML IJ SOLN
4.0000 mg | Freq: Once | INTRAMUSCULAR | Status: AC
  Administered 2019-11-20: 4 mg via INTRAVENOUS
  Filled 2019-11-20: qty 2

## 2019-11-20 MED ORDER — SODIUM CHLORIDE 0.9 % IV BOLUS
1000.0000 mL | Freq: Once | INTRAVENOUS | Status: AC
  Administered 2019-11-20: 1000 mL via INTRAVENOUS

## 2019-11-20 MED ORDER — SODIUM CHLORIDE 0.9% FLUSH: 3.0000 mL | Freq: Once | INTRAVENOUS | Status: DC

## 2019-11-20 MED ORDER — LORAZEPAM 2 MG/ML IJ SOLN
1.0000 mg | Freq: Once | INTRAMUSCULAR | Status: AC
  Administered 2019-11-20: 1 mg via INTRAVENOUS
  Filled 2019-11-20: qty 1

## 2019-11-20 NOTE — Progress Notes (Signed)
CSW confirmed with [facility] that they could make accommodations for Pt.  CSW discussed placement option with Pt via note writing. Pt agreed to placement.  CSW updated EDP. TOC team will make transportation accomodation.

## 2019-11-20 NOTE — ED Notes (Signed)
ASL interpreter used.

## 2019-11-20 NOTE — Progress Notes (Signed)
Upon arrival at [facility], facility requested refax of notes. CSW refaxed notes to [phone] per request

## 2019-11-20 NOTE — Social Work (Signed)
CSW met with Pt at bedside with the assistance of interpreter system.  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 CSW has called [facility] in [place] in an effort to find detox placement. \Awaiting callback.

## 2019-11-20 NOTE — ED Notes (Signed)
Discharge instructions reviewed with pt. Pt verbalized understanding. ASL interp. used.  Pt discharged with [facility] transport to [facility]. CSW called facility to notify that pt is on the way.

## 2019-11-20 NOTE — ED Notes (Signed)
Pt transported to xray 

## 2019-11-20 NOTE — ED Notes (Signed)
Pt given food

## 2019-11-20 NOTE — Progress Notes (Signed)
CSW called [facility] and informed them that Pt would be transferring soon. CSW called [facility] for transportation.

## 2019-11-20 NOTE — Discharge Instructions (Signed)
[facility] [place] as discussed with the social work team for your meth abuse.  If you develop chest pain, difficulty breathing, vomiting or other new concerning symptom, recommend return to ER for reassessment.  Recommend follow-up with PCP as well.

## 2019-11-20 NOTE — ED Notes (Signed)
RN and Dr. Stevie Kern at bedside. This RN and the MD attempted to communicate plan of care with pt with ASL interpreter. Pt trying to sleeping. covering face, and not participating in conversation.

## 2019-11-20 NOTE — ED Provider Notes (Signed)
[facility] EMERGENCY DEPARTMENT Provider Note   CSN: [id number] Arrival date & time: 11/20/19  1506     History Chief Complaint  Patient presents with  . Emesis    Carl Skinner is a 49 y.o. male.  Death, utilized ASL interpreter.  States that over the last few days he has been having intermittent nausea and vomiting as well as occasional episode of loose stool.  Is vomited 5 times in total today, nonbloody nonbilious.  No blood in stool.  No abdominal pain.  Also states that he is trying to wean himself off meth.  Concerned couple days ago he may have stepped on something on his left foot and has been having left foot pain ever since.  HPI     Past Medical History:  Diagnosis Date  . Anxiety   . Deaf   . Depression     There are no problems to display for this patient.   Past Surgical History:  Procedure Laterality Date  . BICEPT TENODESIS Left 06/20/2019   Procedure: BICEPS TENODESIS;  Surgeon: Bjorn Pippin, MD;  Location: [facility];  Service: Orthopedics;  Laterality: Left;  . NOSE SURGERY    . ROTATOR CUFF REPAIR Left        Family History  Problem Relation Age of Onset  . Healthy Mother   . Healthy Father     Social History   Tobacco Use  . Smoking status: Current Every Day Smoker    Packs/day: 0.50    Types: Cigarettes  . Smokeless tobacco: Never Used  Substance Use Topics  . Alcohol use: Not Currently  . Drug use: Yes    Types: Methamphetamines    Home Medications Prior to Admission medications   Medication Sig Start Date End Date Taking? Authorizing Provider  apixaban (ELIQUIS) 5 MG TABS tablet Take 5 mg by mouth. 06/29/19   [provider]  diclofenac (VOLTAREN) 75 MG EC tablet Take 1 tablet (75 mg total) by mouth 2 (two) times daily. 06/20/19   McBane, Jerald Kief, PA-C  DULoxetine (CYMBALTA) 20 MG capsule Take 20 mg by mouth daily.    [provider]  HYDROcodone-acetaminophen  (NORCO/VICODIN) 5-325 MG tablet Take 1 tablet by mouth every 6 (six) hours as needed for moderate pain or severe pain. 07/03/19   Georgetta Haber, NP  sulfamethoxazole-trimethoprim (BACTRIM DS) 800-160 MG tablet Take 1 tablet by mouth 2 (two) times daily.    [provider]    Allergies    Shrimp [shellfish allergy]  Review of Systems   Review of Systems  Constitutional: Negative for chills and fever.  HENT: Negative for ear pain and sore throat.   Eyes: Negative for pain and visual disturbance.  Respiratory: Negative for cough and shortness of breath.   Cardiovascular: Negative for chest pain and palpitations.  Gastrointestinal: Negative for abdominal pain and vomiting.  Genitourinary: Negative for dysuria and hematuria.  Musculoskeletal: Positive for arthralgias. Negative for back pain.  Skin: Negative for color change and rash.  Neurological: Negative for seizures and syncope.  All other systems reviewed and are negative.   Physical Exam Updated Vital Signs BP 117/73   Pulse 68   Temp 98 F (36.7 C) (Oral)   Resp 16   Ht 5\' 10"  (1.778 m)   Wt 99.8 kg   SpO2 100%   BMI 31.57 kg/m   Physical Exam Vitals and nursing note reviewed.  Constitutional:      Appearance: He is  well-developed.  HENT:     Head: Normocephalic and atraumatic.  Eyes:     Conjunctiva/sclera: Conjunctivae normal.  Cardiovascular:     Rate and Rhythm: Normal rate and regular rhythm.     Heart sounds: No murmur heard.   Pulmonary:     Effort: Pulmonary effort is normal. No respiratory distress.     Breath sounds: Normal breath sounds.  Abdominal:     Palpations: Abdomen is soft.     Tenderness: There is no abdominal tenderness.  Musculoskeletal:     Cervical back: Neck supple.     Comments: L foot: there is mild swelling and tenderness to plantar surface of forefoot, no erythema or induration  Skin:    General: Skin is warm and dry.  Neurological:     Mental Status: He is alert.      ED Results / Procedures / Treatments   Labs (all labs ordered are listed, but only abnormal results are displayed) Labs Reviewed  COMPREHENSIVE METABOLIC PANEL - Abnormal; Notable for the following components:      Result Value   Sodium 131 (*)    Chloride 91 (*)    Glucose, Bld 101 (*)    BUN 32 (*)    AST 56 (*)    All other components within normal limits  RAPID URINE DRUG SCREEN, HOSP PERFORMED - Abnormal; Notable for the following components:   Cocaine POSITIVE (*)    Amphetamines POSITIVE (*)    All other components within normal limits  LACTIC ACID, PLASMA - Abnormal; Notable for the following components:   Lactic Acid, Venous 4.0 (*)    All other components within normal limits  LACTIC ACID, PLASMA - Abnormal; Notable for the following components:   Lactic Acid, Venous 2.6 (*)    All other components within normal limits  LIPASE, BLOOD  CBC  URINALYSIS, ROUTINE W REFLEX MICROSCOPIC    EKG EKG Interpretation  Date/Time:  Wednesday November 20 2019 15:33:12 EDT Ventricular Rate:  45 PR Interval:  146 QRS Duration: 102 QT Interval:  520 QTC Calculation: 449 R Axis:   50 Text Interpretation: Sinus bradycardia Otherwise normal ECG Confirmed by Kennis Carina [phone]) on 11/20/2019 3:46:14 PM   Radiology DG Foot Complete Left  Result Date: 11/20/2019 CLINICAL DATA:  Left plantar foot pain. EXAM: LEFT FOOT - COMPLETE 3+ VIEW COMPARISON:  None. FINDINGS: There is no evidence of fracture or dislocation. Mild joint space narrowing and marginal osteophyte formation along the lateral margin of the first MTP joint. Joint spaces are otherwise preserved. Soft tissues are unremarkable. No radiopaque foreign body. IMPRESSION: 1. No acute fracture or radiopaque foreign body of the left foot. 2. Mild degenerative changes of the first MTP joint. Electronically Signed   By: Duanne Guess D.O.   On: 11/20/2019 16:40    Procedures Procedures (including critical care  time)  Medications Ordered in ED Medications  sodium chloride flush (NS) 0.9 % injection 3 mL (has no administration in time range)  LORazepam (ATIVAN) injection 1 mg (1 mg Intravenous Given 11/20/19 1724)  ondansetron (ZOFRAN) injection 4 mg (4 mg Intravenous Given 11/20/19 1722)  sodium chloride 0.9 % bolus 1,000 mL (0 mLs Intravenous Stopped 11/20/19 1831)  sodium chloride 0.9 % bolus 1,000 mL (0 mLs Intravenous Stopped 11/20/19 2138)    ED Course  I have reviewed the triage vital signs and the nursing notes.  Pertinent labs & imaging results that were available during my care of the patient were reviewed by  me and considered in my medical decision making (see chart for details).    MDM Rules/Calculators/A&P                         49 year old male presents to ER with 3 concerns. Regarding foot pain, no obvious deformity on exam, no obvious infection, obtained x-ray which is negative for fracture, foreign body. Regarding nausea and vomiting, obtain labs, patient was provided with antiemetics, fluids. Labs noted for mild hyponatremia but otherwise normal electrolytes, lactic acid initially was elevated. UA negative, UDS positive for benzos and meth. After patient received meds, fluids, his GI symptoms have completely resolved. His abdomen remained soft on repeat exams. Vital signs remained stable. Repeat lactate was improved. Given his overall clinical picture with reassuring exam and vital signs, believe patient can be discharged. Patient also requested rehab. Case management evaluated patient, able to secure place at [facility]. Patient will be transported to [facility].    After the discussed management above, the patient was determined to be safe for discharge.  The patient was in agreement with this plan and all questions regarding their care were answered.  ED return precautions were discussed and the patient will return to the ED with any significant worsening of condition.  Final  Clinical Impression(s) / ED Diagnoses Final diagnoses:  Nausea and vomiting, intractability of vomiting not specified, unspecified vomiting type  Methamphetamine abuse [facility])    Rx / DC Orders ED Discharge Orders    None       Milagros Loll, MD 11/20/19 2353

## 2019-11-20 NOTE — ED Triage Notes (Signed)
Pt brought to ED by [facility] from [facility] for c/o nausea and vomiting for the past 3 days, pt is addicted to Meth and is trying to quick by him self. Possible withdraw. SBP by EMS 90, HR 56, R18, SPO2 100% RA. cbg 158.

## 2020-05-29 ENCOUNTER — Ambulatory Visit (HOSPITAL_COMMUNITY)
Admission: EM | Admit: 2020-05-29 | Discharge: 2020-05-29 | Disposition: A | Discharge status: Home / Self Care | Payer: Medicare Other | Attending: Student | Admitting: Student

## 2020-05-29 ENCOUNTER — Other Ambulatory Visit: Payer: Self-pay

## 2020-05-29 ENCOUNTER — Encounter (HOSPITAL_COMMUNITY): Payer: Self-pay | Admitting: Emergency Medicine

## 2020-05-29 DIAGNOSIS — Z86718 Personal history of other venous thrombosis and embolism: Secondary | ICD-10-CM

## 2020-05-29 DIAGNOSIS — F329 Major depressive disorder, single episode, unspecified: Secondary | ICD-10-CM

## 2020-05-29 DIAGNOSIS — R0989 Other specified symptoms and signs involving the circulatory and respiratory systems: Secondary | ICD-10-CM

## 2020-05-29 DIAGNOSIS — L600 Ingrowing nail: Secondary | ICD-10-CM

## 2020-05-29 DIAGNOSIS — Z760 Encounter for issue of repeat prescription: Secondary | ICD-10-CM | POA: Diagnosis not present

## 2020-05-29 DIAGNOSIS — H9193 Unspecified hearing loss, bilateral: Secondary | ICD-10-CM

## 2020-05-29 MED ORDER — CITALOPRAM HYDROBROMIDE 20 MG PO TABS: 20.0000 mg | ORAL_TABLET | Freq: Every day | ORAL | 0 refills | Status: DC

## 2020-05-29 NOTE — ED Notes (Signed)
Pt states he cannot do ultrasound today but he will go to ED per provider advice

## 2020-05-29 NOTE — ED Triage Notes (Signed)
Pt presents for medication refill of duloxetine and toe pain xs 1-2 months.   Pt states is taking 60mg  of duloxetine daily now.

## 2020-05-29 NOTE — Discharge Instructions (Addendum)
-  I refilled your Celexa. Continue this as directed for depression/anxiety.  -Please schedule an appointment with podiatrist for management of ingrown toenail (information below) -If you develop chest pain, shortness of breath, etc- head straight to ED. If you are not able to have the ultrasound of your left arm performed today, you MUST head to the ED for evaluation of DVT. An untreated clot in a blood vessel can lead to death.

## 2020-05-29 NOTE — ED Provider Notes (Addendum)
[facility]    CSN: [id number] Arrival date & time: 05/29/20  1320      History   Chief Complaint Chief Complaint  Patient presents with  . Medication Refill  . Toe Pain    HPI Carl Skinner is a 50 y.o. male presenting with multiple complaints. This patient is deaf and uses ASL for communication; interpreter used today. History of anxiety and depression controlled on celexa.  -this patient has a history of DVT 1 year ago in his left upper extremity. He states it travelled into his neck. He was put on eliquis for this at the time with resolution of DVT. Today he presents with few days of venous tenderness and firmness, left lower arm. Requesting refill on eliquis. He has not taken eliquis in about 1 year. He denies shortness of breath, chest pain, leg swelling, recent travel/immobilization.  -presenting with ingrown toenail on right foot for few months. Has been unable to resolve this on his own. Requesting removal today. -States he needs a refill of celexa for his depression/anxiety. Has been on this for years with good control. Denies SI/HI. -Patient with history of chronic right shoulder pain, takes hydrocodone for pain control and states he needs a refill on this.   HPI  Past Medical History:  Diagnosis Date  . Anxiety   . Deaf   . Depression     There are no problems to display for this patient.   Past Surgical History:  Procedure Laterality Date  . BICEPT TENODESIS Left 06/20/2019   Procedure: BICEPS TENODESIS;  Surgeon: Bjorn Pippin, MD;  Location: [facility];  Service: Orthopedics;  Laterality: Left;  . NOSE SURGERY    . ROTATOR CUFF REPAIR Left        Home Medications    Prior to Admission medications   Medication Sig Start Date End Date Taking? Authorizing Provider  citalopram (CELEXA) 20 MG tablet Take 1 tablet (20 mg total) by mouth daily. 05/29/20 06/28/20 Yes Rhys Martini, PA-C  apixaban (ELIQUIS) 5 MG TABS tablet Take 5 mg  by mouth. 06/29/19   [provider]  diclofenac (VOLTAREN) 75 MG EC tablet Take 1 tablet (75 mg total) by mouth 2 (two) times daily. 06/20/19   McBane, Jerald Kief, PA-C  DULoxetine (CYMBALTA) 20 MG capsule Take 20 mg by mouth daily.    [provider]  HYDROcodone-acetaminophen (NORCO/VICODIN) 5-325 MG tablet Take 1 tablet by mouth every 6 (six) hours as needed for moderate pain or severe pain. 07/03/19   Georgetta Haber, NP  sulfamethoxazole-trimethoprim (BACTRIM DS) 800-160 MG tablet Take 1 tablet by mouth 2 (two) times daily.    [provider]    Family History Family History  Problem Relation Age of Onset  . Healthy Mother   . Healthy Father     Social History Social History   Tobacco Use  . Smoking status: Current Every Day Smoker    Packs/day: 0.50    Types: Cigarettes  . Smokeless tobacco: Never Used  Substance Use Topics  . Alcohol use: Not Currently  . Drug use: Yes    Types: Methamphetamines     Allergies   Shrimp [shellfish allergy]   Review of Systems Review of Systems  Musculoskeletal:       Right shoulder pain  Venous tenderness left arm  Ingrown toenail right foot   Psychiatric/Behavioral:       Depression and anxiety  All other systems reviewed and are negative.  Physical Exam Triage Vital Signs ED Triage Vitals  Enc Vitals Group     BP 05/29/20 1357 112/75     Pulse Rate 05/29/20 1357 61     Resp 05/29/20 1357 19     Temp 05/29/20 1357 97.7 F (36.5 C)     Temp Source 05/29/20 1357 Oral     SpO2 05/29/20 1357 96 %     Weight --      Height --      Head Circumference --      Peak Flow --      Pain Score 05/29/20 1353 6     Pain Loc --      Pain Edu? --      Excl. in GC? --    No data found.  Updated Vital Signs BP 112/75 (BP Location: Right Arm)   Pulse 61   Temp 97.7 F (36.5 C) (Oral)   Resp 19   SpO2 96%   Visual Acuity Right Eye Distance:   Left Eye Distance:   Bilateral Distance:    Right  Eye Near:   Left Eye Near:    Bilateral Near:     Physical Exam Vitals reviewed.  Constitutional:      Appearance: Normal appearance.  Cardiovascular:     Rate and Rhythm: Normal rate and regular rhythm.     Pulses:          Radial pulses are 2+ on the right side and 2+ on the left side.     Heart sounds: Normal heart sounds.     Comments: Negative homan sign  Left arm with multiple areas of tenderness and firmness along venous system.  Pulmonary:     Effort: Pulmonary effort is normal.     Breath sounds: Normal breath sounds. No wheezing, rhonchi or rales.  Abdominal:     General: Abdomen is flat.     Tenderness: There is no abdominal tenderness. There is no guarding or rebound.  Musculoskeletal:     Right lower leg: No edema.     Left lower leg: No edema.  Skin:    Comments: R middle toenail mildly ingrown medially. No surrounding erythema or discharge.   Neurological:     General: No focal deficit present.     Mental Status: He is alert and oriented to person, place, and time.  Psychiatric:        Mood and Affect: Mood normal.        Behavior: Behavior normal.        Thought Content: Thought content normal.        Judgment: Judgment normal.      UC Treatments / Results  Labs (all labs ordered are listed, but only abnormal results are displayed) Labs Reviewed - No data to display  EKG   Radiology No results found.  Procedures Procedures (including critical care time)  Medications Ordered in UC Medications - No data to display  Initial Impression / Assessment and Plan / UC Course  I have reviewed the triage vital signs and the nursing notes.  Pertinent labs & imaging results that were available during my care of the patient were reviewed by me and considered in my medical decision making (see chart for details).     This patient is a 50 year old man presenting with multiple complaints, including possible DVT. He has a history of DVT that he was treated  for using Eliquis 06/2019. On exam, vein of left arm is indeed tender and tense;  I am concerned for DVT.   We attempted to schedule ultrasound outpatient for evaluation of this patient's possible DVT; however given time of day, we were unable to accomplish this. Today he is  afebrile nontachycardic nontachypneic, oxygenating well on room air, denies shortness of breath. I have a low suspicion of PE at this time. We discussed that it is my medical opinion that this patient should head to the ED for further evaluation and management of possible DVT. He verbalizes that he will not head to the ED and that he will manage this outpatient. I stressed that he could die if he does not head to the ED for further evaluation. He verbalizes understanding.   I refilled this patient's Celexa today, which he has been on for years for depression.   (*05/30/2020 update patient called using interpreter and is now saying he actually needs duloxetine 60mg . Sent this. He stated multiple times he needs 60mg  despite chart saying he takes 20mg . I reiterated he must go to ED for DVT evaluation or he could die*.)  Patient requesting refill of Hydrocodone for management of chronic shoulder pain; I discussed that he should f/u with orthopedist for this, as we do not treat chronic pain at this clinic.   This patient also presenting with ingrown toenail. Podiatry information provided; he will schedule an appointment with them.   We discussed that given his multiple chronic and acute medical conditions, I strongly advise establishing care with PCP. He states he has an appointment to establish care with PCP in 1 month. Encouraged him to keep this appointment.   Using language line, spent over 60 minutes obtaining H&P, performing physical, discussing results, treatment plan and plan for follow-up with patient. Patient agrees with plan.   This chart was dictated using voice recognition software, Dragon. Despite the best efforts of this  provider to proofread and correct errors, errors may still occur which can change documentation meaning.   Final Clinical Impressions(s) / UC Diagnoses   Final diagnoses:  Medication refill  History of DVT in adulthood  Major depressive disorder with current active episode, unspecified depression episode severity, unspecified whether recurrent  Ingrown toenail of right foot     Discharge Instructions     -I refilled your Celexa. Continue this as directed for depression/anxiety.  -Please schedule an appointment with podiatrist for management of ingrown toenail (information below) -If you develop chest pain, shortness of breath, etc- head straight to ED. If you are not able to have the ultrasound of your left arm performed today, you MUST head to the ED for evaluation of DVT. An untreated clot in a blood vessel can lead to death.    ED Prescriptions    Medication Sig Dispense Auth. Provider   citalopram (CELEXA) 20 MG tablet Take 1 tablet (20 mg total) by mouth daily. 30 tablet , PA-C     PDMP not reviewed this encounter.   , PA-C 05/29/20 1554    Rhys Martini, PA-C 05/30/20 1259

## 2020-05-30 ENCOUNTER — Telehealth (HOSPITAL_COMMUNITY): Payer: Self-pay | Admitting: Student

## 2020-05-30 ENCOUNTER — Telehealth (HOSPITAL_COMMUNITY): Payer: Self-pay

## 2020-05-30 MED ORDER — DULOXETINE HCL 60 MG PO CPEP: 60.0000 mg | ORAL_CAPSULE | Freq: Every day | ORAL | 0 refills | Status: DC

## 2020-05-30 NOTE — Telephone Encounter (Signed)
Patient calls stating he actually requires refill of duloxetine, not celexa. Sent.

## 2020-05-30 NOTE — Telephone Encounter (Signed)
Pt called with use of ASL interpreter requesting correction/new refill of duloxetine 60mg  per day. Rx was sent by L. , who also advised to re-iterated to pt that he needs to go to ED STAT for u/s of arm for likely DVT. Pt states understanding of same, but states he will attempt to go to PMD next week for eval of DVT symptoms.

## 2020-06-27 ENCOUNTER — Encounter (HOSPITAL_COMMUNITY): Payer: Self-pay

## 2020-06-27 ENCOUNTER — Ambulatory Visit (HOSPITAL_COMMUNITY)
Admission: EM | Admit: 2020-06-27 | Discharge: 2020-06-27 | Disposition: A | Discharge status: Home / Self Care | Payer: Medicare Other | Attending: Family Medicine | Admitting: Family Medicine

## 2020-06-27 DIAGNOSIS — F332 Major depressive disorder, recurrent severe without psychotic features: Secondary | ICD-10-CM | POA: Diagnosis not present

## 2020-06-27 MED ORDER — DULOXETINE HCL 60 MG PO CPEP: 60.0000 mg | ORAL_CAPSULE | Freq: Every day | ORAL | 0 refills | Status: DC

## 2020-06-27 NOTE — ED Triage Notes (Signed)
Pt present for medication refill on duloxetine. Pt states is taking 60 mg but would like his dosage changed from 60 mg to higher. Pt run out of medication today. He has an appointment with his PCP on 07/13/20

## 2020-06-27 NOTE — ED Provider Notes (Signed)
[facility]    CSN: [id number] Arrival date & time: 06/27/20  1637      History   Chief Complaint Chief Complaint  Patient presents with  . Medication Refill    HPI Carl Skinner is a 50 y.o. male.   Medical interpreter used to facilitate visit today using ASL with patient's consent.  Patient with history of polysubstance abuse, severe depression and anxiety presenting for refill on his duloxetine but also wanting to discuss adding medication or increasing his dose as he is still having issues with angry outbursts, OCD behaviors.  He denies current suicidal or homicidal ideation, missed doses or inappropriate use of his medication.  He has a follow-up scheduled with his PCP in about 2 weeks but does not want to wait that long.     Past Medical History:  Diagnosis Date  . Anxiety   . Deaf   . Depression     There are no problems to display for this patient.   Past Surgical History:  Procedure Laterality Date  . BICEPT TENODESIS Left 06/20/2019   Procedure: BICEPS TENODESIS;  Surgeon: Bjorn Pippin, MD;  Location: [facility];  Service: Orthopedics;  Laterality: Left;  . NOSE SURGERY    . ROTATOR CUFF REPAIR Left        Home Medications    Prior to Admission medications   Medication Sig Start Date End Date Taking? Authorizing Provider  apixaban (ELIQUIS) 5 MG TABS tablet Take 5 mg by mouth. 06/29/19   [provider]  citalopram (CELEXA) 20 MG tablet Take 1 tablet (20 mg total) by mouth daily. 05/29/20 06/28/20  Rhys Martini, PA-C  diclofenac (VOLTAREN) 75 MG EC tablet Take 1 tablet (75 mg total) by mouth 2 (two) times daily. 06/20/19   McBane, Jerald Kief, PA-C  DULoxetine (CYMBALTA) 60 MG capsule Take 1 capsule (60 mg total) by mouth daily. 06/27/20 07/27/20  Particia Nearing, PA-C  HYDROcodone-acetaminophen (NORCO/VICODIN) 5-325 MG tablet Take 1 tablet by mouth every 6 (six) hours as needed for moderate pain or severe pain.  07/03/19   Georgetta Haber, NP  sulfamethoxazole-trimethoprim (BACTRIM DS) 800-160 MG tablet Take 1 tablet by mouth 2 (two) times daily.    [provider]    Family History Family History  Problem Relation Age of Onset  . Healthy Mother   . Healthy Father     Social History Social History   Tobacco Use  . Smoking status: Current Every Day Smoker    Packs/day: 0.50    Types: Cigarettes  . Smokeless tobacco: Never Used  Substance Use Topics  . Alcohol use: Not Currently  . Drug use: Yes    Types: Methamphetamines     Allergies   Shrimp [shellfish allergy]   Review of Systems Review of Systems Per HPI  Physical Exam Triage Vital Signs ED Triage Vitals  Enc Vitals Group     BP 06/27/20 1700 126/86     Pulse Rate 06/27/20 1700 (!) 55     Resp 06/27/20 1700 18     Temp 06/27/20 1700 98.9 F (37.2 C)     Temp Source 06/27/20 1700 Oral     SpO2 06/27/20 1700 98 %     Weight --      Height --      Head Circumference --      Peak Flow --      Pain Score 06/27/20 1659 0     Pain Loc --  Pain Edu? --      Excl. in GC? --    No data found.  Updated Vital Signs BP 126/86 (BP Location: Right Arm)   Pulse (!) 55   Temp 98.9 F (37.2 C) (Oral)   Resp 18   SpO2 98%   Visual Acuity Right Eye Distance:   Left Eye Distance:   Bilateral Distance:    Right Eye Near:   Left Eye Near:    Bilateral Near:     Physical Exam Vitals and nursing note reviewed.  Constitutional:      Appearance: Normal appearance.  HENT:     Head: Atraumatic.  Eyes:     Extraocular Movements: Extraocular movements intact.     Conjunctiva/sclera: Conjunctivae normal.  Cardiovascular:     Rate and Rhythm: Normal rate and regular rhythm.  Pulmonary:     Effort: Pulmonary effort is normal.     Breath sounds: Normal breath sounds.  Musculoskeletal:        General: Normal range of motion.     Cervical back: Normal range of motion and neck supple.  Skin:    General:  Skin is warm and dry.  Neurological:     Mental Status: Mental status is at baseline.     Motor: No weakness.  Psychiatric:        Mood and Affect: Mood normal.        Thought Content: Thought content normal.        Judgment: Judgment normal.      UC Treatments / Results  Labs (all labs ordered are listed, but only abnormal results are displayed) Labs Reviewed - No data to display  EKG   Radiology No results found.  Procedures Procedures (including critical care time)  Medications Ordered in UC Medications - No data to display  Initial Impression / Assessment and Plan / UC Course  I have reviewed the triage vital signs and the nursing notes.  Pertinent labs & imaging results that were available during my care of the patient were reviewed by me and considered in my medical decision making (see chart for details).     Discussed at length with patient that we would refill his current medication at current dose, but he will need to follow-up with PCP for any dose changes or additional medications.  I did offer hydroxyzine but he states he has used this in the past for his anxiety without any benefit.  Did also provide the behavioral health urgent care information in case he needed to be seen by specialist sooner than his primary care visit, particularly if he feels he is a danger to himself or others at any time.  He is not currently having any suicidal or homicidal ideation.  Final Clinical Impressions(s) / UC Diagnoses   Final diagnoses:  Severe episode of recurrent major depressive disorder, without psychotic features [facility])     Discharge Instructions     [facility]  Phone: [phone]  Address: [address] [place], [place] [address]  Hours: Open 24/7, No appointment required.    ED Prescriptions    Medication Sig Dispense Auth. Provider   DULoxetine (CYMBALTA) 60 MG capsule Take 1 capsule (60 mg total) by mouth daily. 30  capsule Particia Nearing, [place]     PDMP not reviewed this encounter.   Particia Nearing, [place] 06/27/20 1718

## 2020-06-27 NOTE — Discharge Instructions (Signed)
[facility]  Phone: [phone]  Address: [address] [place], [place] [address]  Hours: Open 24/7, No appointment required.

## 2020-07-06 ENCOUNTER — Ambulatory Visit (HOSPITAL_COMMUNITY)
Admission: EM | Admit: 2020-07-06 | Discharge: 2020-07-06 | Disposition: A | Discharge status: Home / Self Care | Payer: Medicare Other | Attending: Emergency Medicine | Admitting: Emergency Medicine

## 2020-07-06 ENCOUNTER — Encounter (HOSPITAL_COMMUNITY): Payer: Self-pay

## 2020-07-06 DIAGNOSIS — G244 Idiopathic orofacial dystonia: Secondary | ICD-10-CM

## 2020-07-06 DIAGNOSIS — H5789 Other specified disorders of eye and adnexa: Secondary | ICD-10-CM | POA: Diagnosis not present

## 2020-07-06 MED ORDER — HYPROMELLOSE (GONIOSCOPIC) 2.5 % OP SOLN: 2.0000 [drp] | OPHTHALMIC | 12 refills | Status: DC | PRN

## 2020-07-06 NOTE — Discharge Instructions (Signed)
I have sent for some artificial tears you can use frequently to re-wet your eyes to prevent irritation.  I do not see an obvious source of your jaw symptoms, as it seems unlikely that the medications you are taking are causing this- although medications sometimes can cause these sort of uncontrolled spasms.  Please follow up with your psychiatrist for medication management as well as your primary care provider as you may need additional referral.

## 2020-07-06 NOTE — ED Provider Notes (Signed)
[facility]    CSN: [id number] Arrival date & time: 07/06/20  1906      History   Chief Complaint Chief Complaint  Patient presents with  . mouth problem  . Eye Problem    HPI Carl Skinner is a 50 y.o. male.   Carl Skinner presents with complaints of a jaw issue he has noted for the past week. He feels like his jaw with involuntarily rapid shut of his jaw, at times causing him to bite the sides of his mouth and sometimes his lip. Last night he caused his lip to bleed even. 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 He states he hasn't used meth in a year now. Also states he has uncontrolled eye blinking at times. Denies any eye irritation, redness or tearing. He has appointment with behavioral health tomorrow. Has been seen here recently for medication refill, states he is taking duloxetine but not taking citalopram. He doesn't have a PCP.    ASL video interpreter used to collect history and physical exam.     ROS per HPI, negative if not otherwise mentioned.      Past Medical History:  Diagnosis Date  . Anxiety   . Deaf   . Depression     There are no problems to display for this patient.   Past Surgical History:  Procedure Laterality Date  . BICEPT TENODESIS Left 06/20/2019   Procedure: BICEPS TENODESIS;  Surgeon: Bjorn Pippin, MD;  Location: [facility];  Service: Orthopedics;  Laterality: Left;  . NOSE SURGERY    . ROTATOR CUFF REPAIR Left        Home Medications    Prior to Admission medications   Medication Sig Start Date End Date Taking? Authorizing Provider  hydroxypropyl methylcellulose / hypromellose (ISOPTO TEARS / GONIOVISC) 2.5 % ophthalmic solution Place 2 drops into both eyes as needed for dry eyes. 07/06/20  Yes Darrik Richman, Dorene Grebe B, NP  apixaban (ELIQUIS) 5 MG TABS tablet Take 5 mg by mouth. 06/29/19   [provider]  citalopram (CELEXA) 20 MG tablet Take 1  tablet (20 mg total) by mouth daily. 05/29/20 06/28/20  Rhys Martini, PA-C  diclofenac (VOLTAREN) 75 MG EC tablet Take 1 tablet (75 mg total) by mouth 2 (two) times daily. 06/20/19   McBane, Jerald Kief, PA-C  DULoxetine (CYMBALTA) 60 MG capsule Take 1 capsule (60 mg total) by mouth daily. 06/27/20 07/27/20  Particia Nearing, PA-C  HYDROcodone-acetaminophen (NORCO/VICODIN) 5-325 MG tablet Take 1 tablet by mouth every 6 (six) hours as needed for moderate pain or severe pain. 07/03/19   Georgetta Haber, NP  sulfamethoxazole-trimethoprim (BACTRIM DS) 800-160 MG tablet Take 1 tablet by mouth 2 (two) times daily.    [provider]    Family History Family History  Problem Relation Age of Onset  . Healthy Mother   . Healthy Father     Social History Social History   Tobacco Use  . Smoking status: Current Every Day Smoker    Packs/day: 0.50    Types: Cigarettes  . Smokeless tobacco: Never Used  Substance Use Topics  . Alcohol use: Not Currently  . Drug use: Yes    Types: Methamphetamines     Allergies   Shrimp [shellfish allergy]   Review of Systems Review of Systems   Physical Exam Triage Vital Signs ED Triage Vitals  Enc Vitals Group     BP  07/06/20 1932 120/79     Pulse Rate 07/06/20 1932 68     Resp 07/06/20 1932 18     Temp 07/06/20 1932 98.7 F (37.1 C)     Temp Source 07/06/20 1932 Oral     SpO2 07/06/20 1932 96 %     Weight --      Height --      Head Circumference --      Peak Flow --      Pain Score 07/06/20 1930 0     Pain Loc --      Pain Edu? --      Excl. in GC? --    No data found.  Updated Vital Signs BP 120/79 (BP Location: Left Arm)   Pulse 68   Temp 98.7 F (37.1 C) (Oral)   Resp 18   SpO2 96%   Visual Acuity Right Eye Distance:   Left Eye Distance:   Bilateral Distance:    Right Eye Near:   Left Eye Near:    Bilateral Near:     Physical Exam Constitutional:      Appearance: He is well-developed.  HENT:     Head:      Comments: Noted movements of tongue within mouth- rubs inside of his teeth but doesn't stick out tongue; no jaw or eye involuntary movements noted  Cardiovascular:     Rate and Rhythm: Normal rate.  Pulmonary:     Effort: Pulmonary effort is normal.  Skin:    General: Skin is warm and dry.  Neurological:     Mental Status: He is alert and oriented to person, place, and time.      UC Treatments / Results  Labs (all labs ordered are listed, but only abnormal results are displayed) Labs Reviewed - No data to display  EKG   Radiology No results found.  Procedures Procedures (including critical care time)  Medications Ordered in UC Medications - No data to display  Initial Impression / Assessment and Plan / UC Course  I have reviewed the triage vital signs and the nursing notes.  Pertinent labs & imaging results that were available during my care of the patient were reviewed by me and considered in my medical decision making (see chart for details).     Muscle spasms vs tardive dyskinesia vs seizure activity considered. None of the activity he describes witnessed tonight. Appointment with psychiatry tomorrow and scheduled with PCP in a month. No red flag findings at this time. Return precautions provided. Patient indicated understanding and agreeable to plan.   Final Clinical Impressions(s) / UC Diagnoses   Final diagnoses:  Eye irritation  Jaw dyskinesia     Discharge Instructions     I have sent for some artificial tears you can use frequently to re-wet your eyes to prevent irritation.  I do not see an obvious source of your jaw symptoms, as it seems unlikely that the medications you are taking are causing this- although medications sometimes can cause these sort of uncontrolled spasms.  Please follow up with your psychiatrist for medication management as well as your primary care provider as you may need additional referral.    ED Prescriptions    Medication  Sig Dispense Auth. Provider   hydroxypropyl methylcellulose / hypromellose (ISOPTO TEARS / GONIOVISC) 2.5 % ophthalmic solution Place 2 drops into both eyes as needed for dry eyes. 15 mL Georgetta Haber, NP     PDMP not reviewed this encounter.   Trivia Heffelfinger,  Barron Alvine, NP 07/06/20 2042

## 2020-07-06 NOTE — ED Triage Notes (Signed)
Pt reports having eye pain and constantly blinking. Pt states he is constantly biting his mouth. He states he was referred to a Horticulturist, commercial. He states the skeleton Doctor has not helped his problem. He states his eyes are droopy. He states he is constantly biting his tongue or gum.

## 2020-07-10 ENCOUNTER — Encounter (HOSPITAL_COMMUNITY): Payer: Self-pay | Admitting: Physician Assistant

## 2020-07-10 ENCOUNTER — Ambulatory Visit (INDEPENDENT_AMBULATORY_CARE_PROVIDER_SITE_OTHER): Payer: Medicare Other | Admitting: Physician Assistant

## 2020-07-10 ENCOUNTER — Telehealth (HOSPITAL_COMMUNITY): Payer: Self-pay | Admitting: Emergency Medicine

## 2020-07-10 ENCOUNTER — Other Ambulatory Visit: Payer: Self-pay

## 2020-07-10 ENCOUNTER — Telehealth: Payer: Self-pay | Admitting: Internal Medicine

## 2020-07-10 VITALS — BP 111/77 | HR 60 | Ht 70.0 in | Wt 212.0 lb

## 2020-07-10 DIAGNOSIS — F411 Generalized anxiety disorder: Secondary | ICD-10-CM | POA: Insufficient documentation

## 2020-07-10 DIAGNOSIS — R451 Restlessness and agitation: Secondary | ICD-10-CM | POA: Insufficient documentation

## 2020-07-10 DIAGNOSIS — F331 Major depressive disorder, recurrent, moderate: Secondary | ICD-10-CM | POA: Insufficient documentation

## 2020-07-10 MED ORDER — ARIPIPRAZOLE 5 MG PO TABS: 5.0000 mg | ORAL_TABLET | Freq: Every day | ORAL | 1 refills | Status: DC

## 2020-07-10 MED ORDER — SERTRALINE HCL 50 MG PO TABS: 50.0000 mg | ORAL_TABLET | Freq: Every day | ORAL | 1 refills | Status: DC

## 2020-07-10 MED ORDER — HYPROMELLOSE (GONIOSCOPIC) 2.5 % OP SOLN
2.0000 [drp] | OPHTHALMIC | 12 refills | Status: DC | PRN
Start: 2020-07-10 — End: 2020-09-01

## 2020-07-10 MED ORDER — CYCLOSPORINE 0.05 % OP EMUL: 1.0000 [drp] | Freq: Every day | OPHTHALMIC | Status: AC

## 2020-07-10 NOTE — Telephone Encounter (Signed)
Pt states [facility] did not receive eye drop RX. Resent to pharm.

## 2020-07-10 NOTE — Telephone Encounter (Signed)
Pharmacy was not able to fill original prescription, resent alternative, per Dr. Leonides Grills.  Patient updated

## 2020-07-10 NOTE — Progress Notes (Signed)
Psychiatric Initial Adult Assessment   Patient Identification: Carl Skinner MRN:  [id number] Date of Evaluation:  07/10/2020 Referral Source: [facility] Chief Complaint:   Chief Complaint    New Patient (Initial Visit)     Visit Diagnosis:    ICD-10-CM   1. Moderate episode of recurrent major depressive disorder (HCC)  F33.1 sertraline (ZOLOFT) 50 MG tablet    ARIPiprazole (ABILIFY) 5 MG tablet  2. Generalized anxiety disorder  F41.1 sertraline (ZOLOFT) 50 MG tablet    History of Present Illness:    Carl Skinner is a 50 year old male with a past psychiatric history significant for depression who presents to [facility] for psychiatric evaluation and medication management.  Patient is hearing impaired and ASL interpretive services were utilized during the encounter.  Patient states that he has been taking duloxetine 60 mg daily for the management of his depression.  Patient reports that he has been on the medication for a long time but has been experiencing side effects.  Patient reports the following side effect when using duloxetine: facial/eye droop.  Patient states that he went to the emergency room for assessment and they referred him over to [facility] after work up was negative for an emergent cause.  In addition to the side effects, patient states that his medication has not been helpful for the management of his depressive symptoms for quite some time.  Although patient endorses some depressive symptoms during the encounter, he also states that he has been more angry and restless as of late.  Patient expresses that his anger is never directed toward anyone specific and that he gets angry in his head.  Patient denies any other antidepressants in the past but states that he was on Ativan a long time ago to help with his depression.  Patient reports that he stopped taking duloxetine after being assessed in the ED.  Patient also endorses  anxiety he rates a 7 out of 10.  Patient endorses panic attacks with no discernible triggers.  A PHQ 9 was performed with the patient scoring a 10.  A GAD-7 screen was also performed with the patient scoring an 18.  Patient is alert and oriented x 4.  Patient is calm and cooperative as he gives details related to his chief complaint in sign language.  Patient denies suicidal or homicidal ideations.  He further denies auditory or visual hallucinations, however, patient expresses that he has experienced auditory/visual hallucinations while on meth.  Patient endorses good sleep and sleeps on average 5 to 7 hours each night.  Patient endorses good appetite and eats on average 2-3 meals per day.  Patient denies alcohol consumption patient endorses tobacco use and states that one pack of cigarettes takes him 2 to 3 days to finish.  Patient denies active illicit drug use but states that he has used drugs in the past.  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  Associated Signs/Symptoms: Depression Symptoms:  depressed mood, psychomotor agitation, impaired memory, recurrent thoughts of death, anxiety, panic attacks, decreased appetite, (Hypo) Manic Symptoms:  Elevated Mood, Flight of Ideas, Licensed conveyancer, Grandiosity, Impulsivity, Irritable Mood, Labiality of Mood, Anxiety Symptoms:  Excessive Worry, Panic Symptoms, Obsessive Compulsive Symptoms:   Patient reports it's bad, Social Anxiety, Specific Phobias, Psychotic Symptoms:  None reported PTSD Symptoms: Had a traumatic exposure:  None reported Had a traumatic exposure in the  last month:  N/A Re-experiencing:  None Hypervigilance:  Yes Hyperarousal:  Difficulty Concentrating Irritability/Anger Avoidance:  Decreased Interest/Participation Foreshortened Future  Past Psychiatric History:  Patient reports that he has been in  mental health rehab Depression  Previous Psychotropic Medications: Yes   Substance Abuse History in the last 12 months:  No.  Consequences of Substance Abuse: Medical Consequences:  Patient reports that he hospitalized due to drug use a year ago Legal Consequences:  Patient denies legal consequences Family Consequences:  Patient denies family consequences Blackouts:  None DT's: N/A Withdrawal Symptoms:   None  Past Medical History:  Past Medical History:  Diagnosis Date  . Anxiety   . Deaf   . Depression     Past Surgical History:  Procedure Laterality Date  . BICEPT TENODESIS Left 06/20/2019   Procedure: BICEPS TENODESIS;  Surgeon: Bjorn Pippin, MD;  Location: [facility];  Service: Orthopedics;  Laterality: Left;  . NOSE SURGERY    . ROTATOR CUFF REPAIR Left     Family Psychiatric History:  Patient reports that his mother had Alzheimers  Family History:  Family History  Problem Relation Age of Onset  . Healthy Mother   . Healthy Father     Social History:   Social History   Socioeconomic History  . Marital status: Unknown    Spouse name: Not on file  . Number of children: Not on file  . Years of education: Not on file  . Highest education level: Not on file  Occupational History  . Not on file  Tobacco Use  . Smoking status: Current Every Day Smoker    Packs/day: 0.50    Types: Cigarettes  . Smokeless tobacco: Never Used  Substance and Sexual Activity  . Alcohol use: Not Currently  . Drug use: Yes    Types: Methamphetamines  . Sexual activity: Not on file  Other Topics Concern  . Not on file  Social History Narrative  . Not on file   Social Determinants of Health   Financial Resource Strain: Not on file  Food Insecurity: Not on file  Transportation Needs: Not on file  Physical Activity: Not on file  Stress: Not on file  Social Connections: Not on file    Additional Social History:  Patient reports that he is set up with [facility]  services  Allergies:   Allergies  Allergen Reactions  . Shrimp [Shellfish Allergy] Nausea And Vomiting    Metabolic Disorder Labs: No results found for: HGBA1C, MPG No results found for: PROLACTIN No results found for: CHOL, TRIG, HDL, CHOLHDL, VLDL, LDLCALC No results found for: TSH  Therapeutic Level Labs: No results found for: LITHIUM No results found for: CBMZ No results found for: VALPROATE  Current Medications: Current Outpatient Medications  Medication Sig Dispense Refill  . apixaban (ELIQUIS) 5 MG TABS tablet Take 5 mg by mouth.    . ARIPiprazole (ABILIFY) 5 MG tablet Take 1 tablet (5 mg total) by mouth daily. 30 tablet 1  . diclofenac (VOLTAREN) 75 MG EC tablet Take 1 tablet (75 mg total) by mouth 2 (two) times daily. 60 tablet 0  . HYDROcodone-acetaminophen (NORCO/VICODIN) 5-325 MG tablet Take 1 tablet by mouth every 6 (six) hours as needed for moderate pain or severe pain. 10 tablet 0  . hydroxypropyl methylcellulose / hypromellose (ISOPTO TEARS / GONIOVISC) 2.5 % ophthalmic solution Place 2 drops into both eyes as needed for dry eyes. 15 mL 12  . sertraline (ZOLOFT) 50 MG tablet Take  1 tablet (50 mg total) by mouth daily. Take half tablet (25 mg total) for first week. Patient to continue taking full tablet (50 mg total) daily at the start of the second week. 30 tablet 1  . sulfamethoxazole-trimethoprim (BACTRIM DS) 800-160 MG tablet Take 1 tablet by mouth 2 (two) times daily.     No current facility-administered medications for this visit.    Musculoskeletal: Strength & Muscle Tone: within normal limits Gait & Station: normal Patient leans: N/A  Psychiatric Specialty Exam: Review of Systems  Psychiatric/Behavioral: Positive for dysphoric mood. Negative for decreased concentration, hallucinations, self-injury, sleep disturbance and suicidal ideas. The patient is nervous/anxious. The patient is not hyperactive.     Blood pressure 111/77, pulse 60, height 5\' 10"   (1.778 m), weight 212 lb (96.2 kg), SpO2 97 %.Body mass index is 30.42 kg/m.  General Appearance: Fairly Groomed  Eye Contact:  Good  Speech:  Unable to assess. Patient is deaf and uses sign language  Volume:  N/A  Mood:  Anxious and Dysphoric  Affect:  Congruent and Depressed  Thought Process:  Coherent, Goal Directed and Descriptions of Associations: Intact  Orientation:  Full (Time, Place, and Person)  Thought Content:  WDL  Suicidal Thoughts:  No  Homicidal Thoughts:  No  Memory:  Immediate;   Good Recent;   Good Remote;   Good  Judgement:  Good  Insight:  Fair  Psychomotor Activity:  Normal  Concentration:  Concentration: Good and Attention Span: Good  Recall:  Good  Fund of Knowledge:Good  Language: Good  Akathisia:  NA  Handed:  Right  AIMS (if indicated):  not done  Assets:  Communication Skills Desire for Improvement Social Support  ADL's:  Intact  Cognition: WNL  Sleep:  Good   Screenings: GAD-7   Flowsheet Row [facility]  Total GAD-7 Score 18    PHQ2-9   Flowsheet Row [facility]  PHQ-2 Total Score 2  PHQ-9 Total Score 10    Flowsheet Row [facility]  C-SSRS RISK CATEGORY No Risk No Risk No Risk      Assessment and Plan:   Gatha MayerJonathan Schembri is a 50 year old male with a past psychiatric history significant for depression who [facility] psychiatric evaluation and medication management.  Patient reports that his current medication, duloxetine 60 mg daily, has not been helpful in the management of his depression.  In addition to some minor depressive symptoms, patient states that he has been experiencing anger and  restlessness.  Patient reports that he stopped taking duloxetine 2 days ago.  Patient was recommended Zoloft 50 mg daily and Abilify 5 mg daily for the management of his depressive symptoms and anxiety.  Patient is agreeable to recommendation.  Patient's medications will be e-prescribed to pharmacy of choice.  1. Moderate episode of recurrent major depressive disorder (HCC)  - sertraline (ZOLOFT) 50 MG tablet; Take 1 tablet (50 mg total) by mouth daily. Take half tablet (25 mg total) for first week. Patient to continue taking full tablet (50 mg total) daily at the start of the second week.  Dispense: 30 tablet; Refill: 1 - ARIPiprazole (ABILIFY) 5 MG tablet; Take 1 tablet (5 mg total) by mouth daily.  Dispense: 30 tablet; Refill: 1  2. Generalized anxiety disorder  - sertraline (ZOLOFT) 50 MG tablet; Take 1 tablet (50 mg total) by mouth daily. Take half tablet (25 mg total) for first week. Patient to continue taking full tablet (50 mg total) daily at the start of the second week.  Dispense: 30 tablet; Refill: 1  Patient to follow up in 6 weeks  Meta Hatchet, PA 3/25/20221:06 PM

## 2020-07-13 ENCOUNTER — Encounter: Payer: Self-pay | Admitting: Nurse Practitioner

## 2020-07-13 ENCOUNTER — Ambulatory Visit (HOSPITAL_BASED_OUTPATIENT_CLINIC_OR_DEPARTMENT_OTHER): Payer: Medicare Other | Admitting: Nurse Practitioner

## 2020-07-13 ENCOUNTER — Ambulatory Visit (HOSPITAL_COMMUNITY)
Admission: RE | Admit: 2020-07-13 | Discharge: 2020-07-13 | Disposition: A | Discharge status: Home / Self Care | Payer: Medicare Other | Source: Ambulatory Visit | Attending: Nurse Practitioner | Admitting: Nurse Practitioner

## 2020-07-13 ENCOUNTER — Other Ambulatory Visit: Payer: Self-pay

## 2020-07-13 VITALS — BP 133/83 | HR 70 | Resp 18 | Ht 70.0 in | Wt 217.8 lb

## 2020-07-13 DIAGNOSIS — H9042 Sensorineural hearing loss, unilateral, left ear, with unrestricted hearing on the contralateral side: Secondary | ICD-10-CM | POA: Insufficient documentation

## 2020-07-13 DIAGNOSIS — Z79899 Other long term (current) drug therapy: Secondary | ICD-10-CM | POA: Insufficient documentation

## 2020-07-13 DIAGNOSIS — Z1211 Encounter for screening for malignant neoplasm of colon: Secondary | ICD-10-CM | POA: Insufficient documentation

## 2020-07-13 DIAGNOSIS — R7309 Other abnormal glucose: Secondary | ICD-10-CM

## 2020-07-13 DIAGNOSIS — Z7901 Long term (current) use of anticoagulants: Secondary | ICD-10-CM | POA: Insufficient documentation

## 2020-07-13 DIAGNOSIS — M545 Low back pain, unspecified: Secondary | ICD-10-CM | POA: Insufficient documentation

## 2020-07-13 DIAGNOSIS — E785 Hyperlipidemia, unspecified: Secondary | ICD-10-CM | POA: Insufficient documentation

## 2020-07-13 DIAGNOSIS — H9190 Unspecified hearing loss, unspecified ear: Secondary | ICD-10-CM

## 2020-07-13 DIAGNOSIS — R61 Generalized hyperhidrosis: Secondary | ICD-10-CM | POA: Insufficient documentation

## 2020-07-13 DIAGNOSIS — R739 Hyperglycemia, unspecified: Secondary | ICD-10-CM | POA: Insufficient documentation

## 2020-07-13 DIAGNOSIS — R7989 Other specified abnormal findings of blood chemistry: Secondary | ICD-10-CM

## 2020-07-13 MED ORDER — GABAPENTIN 300 MG PO CAPS: 300.0000 mg | ORAL_CAPSULE | Freq: Three times a day (TID) | ORAL | 3 refills | Status: DC
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
## 2020-07-13 NOTE — Progress Notes (Signed)
Pt reports right hip pain

## 2020-07-14 LAB — LIPID PANEL
Chol/HDL Ratio: 4.5 ratio (ref 0.0–5.0)
Cholesterol, Total: 181 mg/dL (ref 100–199)
HDL: 40 mg/dL (ref >39)
LDL Chol Calc (NIH): 95 mg/dL (ref 0–99)
Triglycerides: 271 mg/dL — ABNORMAL HIGH (ref 0–149)
VLDL Cholesterol Cal: 46 mg/dL — ABNORMAL HIGH (ref 5–40)

## 2020-07-14 LAB — CMP14+EGFR
ALT: 25 IU/L (ref 0–44)
AST: 18 IU/L (ref 0–40)
Albumin/Globulin Ratio: 1.5 (ref 1.2–2.2)
Albumin: 4.3 g/dL (ref 4.0–5.0)
Alkaline Phosphatase: 79 IU/L (ref 44–121)
BUN/Creatinine Ratio: 18 (ref 9–20)
BUN: 16 mg/dL (ref 6–24)
Bilirubin Total: 0.2 mg/dL (ref 0.0–1.2)
CO2: 23 mmol/L (ref 20–29)
Calcium: 9.5 mg/dL (ref 8.7–10.2)
Chloride: 105 mmol/L (ref 96–106)
Creatinine, Ser: 0.91 mg/dL (ref 0.76–1.27)
Globulin, Total: 2.8 g/dL (ref 1.5–4.5)
Glucose: 95 mg/dL (ref 65–99)
Potassium: 4.6 mmol/L (ref 3.5–5.2)
Sodium: 143 mmol/L (ref 134–144)
Total Protein: 7.1 g/dL (ref 6.0–8.5)
eGFR: 104 mL/min/{1.73_m2} (ref >59)

## 2020-07-14 LAB — CBC
Hematocrit: 47.6 % (ref 37.5–51.0)
Hemoglobin: 16.1 g/dL (ref 13.0–17.7)
MCH: 30.3 pg (ref 26.6–33.0)
MCHC: 33.8 g/dL (ref 31.5–35.7)
MCV: 90 fL (ref 79–97)
Platelets: 222 10*3/uL (ref 150–450)
RBC: 5.31 x10E6/uL (ref 4.14–5.80)
RDW: 13.1 % (ref 11.6–15.4)
WBC: 6.8 10*3/uL (ref 3.4–10.8)

## 2020-07-14 LAB — HEMOGLOBIN A1C
Est. average glucose Bld gHb Est-mCnc: 120 mg/dL
Hgb A1c MFr Bld: 5.8 % — ABNORMAL HIGH (ref 4.8–5.6)

## 2020-07-15 ENCOUNTER — Other Ambulatory Visit: Payer: Self-pay

## 2020-07-15 ENCOUNTER — Ambulatory Visit (HOSPITAL_COMMUNITY)
Admission: EM | Admit: 2020-07-15 | Discharge: 2020-07-15 | Disposition: A | Discharge status: Home / Self Care | Payer: Medicare Other

## 2020-07-15 ENCOUNTER — Telehealth (HOSPITAL_COMMUNITY): Payer: Self-pay | Admitting: Emergency Medicine

## 2020-07-15 MED ORDER — CYCLOSPORINE 0.05 % OP EMUL: 1.0000 [drp] | Freq: Every day | OPHTHALMIC | 0 refills | Status: DC

## 2020-07-15 NOTE — ED Notes (Signed)
PAtient was checked in bu what was just needing his medications fixed.  Confusion with which medicine to resend to pharmacy, patient still hasn';t been able to pick up eye drops from previous pharmacy.  Resent prescription, verified pharmacy, verified it was new Restasis prescription (telephone note)

## 2020-07-19 ENCOUNTER — Other Ambulatory Visit: Payer: Self-pay | Admitting: Nurse Practitioner

## 2020-07-19 DIAGNOSIS — M545 Low back pain, unspecified: Secondary | ICD-10-CM

## 2020-08-03 ENCOUNTER — Other Ambulatory Visit: Payer: Self-pay | Admitting: Family Medicine

## 2020-08-03 NOTE — Telephone Encounter (Signed)
Requested medication (s) are due for refill today: yes  Requested medication (s) are on the active medication list: no  Last refill:  06/27/20  Future visit scheduled: yes  Notes to clinic: historical provider    Requested Prescriptions  Pending Prescriptions Disp Refills   DULoxetine (CYMBALTA) 60 MG capsule [Pharmacy Med Name: DULOXETINE HCL DR 60 MG CAP] 30 capsule 0    Sig: TAKE 1 CAPSULE BY MOUTH EVERY DAY      Psychiatry: Antidepressants - SNRI Passed - 08/03/2020 12:10 AM      Passed - Completed PHQ-2 or PHQ-9 in the last 360 days      Passed - Last BP in normal range    BP Readings from Last 1 Encounters:  07/13/20 133/83          Passed - Valid encounter within last 6 months    Recent Outpatient Visits           3 weeks ago Hearing loss, unspecified hearing loss type, unspecified laterality   [facility] [place], Shea Stakes, NP       Future Appointments             In 1 month Claiborne Rigg, NP [facility]

## 2020-08-13 ENCOUNTER — Ambulatory Visit (HOSPITAL_COMMUNITY): Payer: Medicare Other | Admitting: Psychiatry

## 2020-08-21 ENCOUNTER — Ambulatory Visit (HOSPITAL_COMMUNITY): Payer: Medicare Other | Admitting: Physician Assistant

## 2020-08-21 ENCOUNTER — Telehealth: Payer: Self-pay | Admitting: Nurse Practitioner

## 2020-08-21 NOTE — Telephone Encounter (Signed)
Will forward to Arna Medici and [place] for [employer]

## 2020-08-21 NOTE — Telephone Encounter (Signed)
Copied from CRM [id number]. Topic: Referral - Status >> Aug 20, 2020  3:58 PM Randol Kern wrote: Best contact: [phone]  Lyndel Pleasure, referrals from [facility].  Called to notify that the patient has not returned calls, they've called several times to schedule patient and have left VM(s). [place] wants office to reach out to patient before she closes the referral. Please advise and update her if patient corresponds.

## 2020-08-24 NOTE — Telephone Encounter (Signed)
NOTED

## 2020-09-01 ENCOUNTER — Emergency Department (HOSPITAL_COMMUNITY)
Admission: EM | Admit: 2020-09-01 | Discharge: 2020-09-01 | Disposition: A | Discharge status: Home / Self Care | Payer: Medicare Other | Attending: Emergency Medicine | Admitting: Emergency Medicine

## 2020-09-01 ENCOUNTER — Encounter (HOSPITAL_COMMUNITY): Payer: Self-pay | Admitting: Emergency Medicine

## 2020-09-01 ENCOUNTER — Emergency Department (HOSPITAL_BASED_OUTPATIENT_CLINIC_OR_DEPARTMENT_OTHER): Payer: Medicare Other

## 2020-09-01 ENCOUNTER — Emergency Department (HOSPITAL_COMMUNITY): Payer: Medicare Other

## 2020-09-01 ENCOUNTER — Other Ambulatory Visit: Payer: Self-pay

## 2020-09-01 DIAGNOSIS — M7989 Other specified soft tissue disorders: Secondary | ICD-10-CM

## 2020-09-01 DIAGNOSIS — S7012XA Contusion of left thigh, initial encounter: Secondary | ICD-10-CM | POA: Insufficient documentation

## 2020-09-01 DIAGNOSIS — R2242 Localized swelling, mass and lump, left lower limb: Secondary | ICD-10-CM | POA: Diagnosis not present

## 2020-09-01 DIAGNOSIS — R609 Edema, unspecified: Secondary | ICD-10-CM

## 2020-09-01 DIAGNOSIS — T148XXA Other injury of unspecified body region, initial encounter: Secondary | ICD-10-CM

## 2020-09-01 DIAGNOSIS — F17210 Nicotine dependence, cigarettes, uncomplicated: Secondary | ICD-10-CM | POA: Diagnosis not present

## 2020-09-01 DIAGNOSIS — S79922A Unspecified injury of left thigh, initial encounter: Secondary | ICD-10-CM | POA: Diagnosis present

## 2020-09-01 DIAGNOSIS — W268XXA Contact with other sharp object(s), not elsewhere classified, initial encounter: Secondary | ICD-10-CM | POA: Diagnosis not present

## 2020-09-01 LAB — CBC WITH DIFFERENTIAL/PLATELET
Abs Immature Granulocytes: 0.04 10*3/uL (ref 0.00–0.07)
Basophils Absolute: 0.1 10*3/uL (ref 0.0–0.1)
Basophils Relative: 0 %
Eosinophils Absolute: 0.1 10*3/uL (ref 0.0–0.5)
Eosinophils Relative: 1 %
HCT: 43 % (ref 39.0–52.0)
Hemoglobin: 14.2 g/dL (ref 13.0–17.0)
Immature Granulocytes: 0 %
Lymphocytes Relative: 23 %
Lymphs Abs: 3 10*3/uL (ref 0.7–4.0)
MCH: 30 pg (ref 26.0–34.0)
MCHC: 33 g/dL (ref 30.0–36.0)
MCV: 90.7 fL (ref 80.0–100.0)
Monocytes Absolute: 0.8 10*3/uL (ref 0.1–1.0)
Monocytes Relative: 6 %
Neutro Abs: 9.3 10*3/uL — ABNORMAL HIGH (ref 1.7–7.7)
Neutrophils Relative %: 70 %
Platelets: 193 10*3/uL (ref 150–400)
RBC: 4.74 MIL/uL (ref 4.22–5.81)
RDW: 13 % (ref 11.5–15.5)
WBC: 13.2 10*3/uL — ABNORMAL HIGH (ref 4.0–10.5)
nRBC: 0 % (ref 0.0–0.2)

## 2020-09-01 LAB — I-STAT CHEM 8, ED
BUN: 25 mg/dL — ABNORMAL HIGH (ref 6–20)
Calcium, Ion: 1.07 mmol/L — ABNORMAL LOW (ref 1.15–1.40)
Chloride: 100 mmol/L (ref 98–111)
Creatinine, Ser: 1 mg/dL (ref 0.61–1.24)
Glucose, Bld: 147 mg/dL — ABNORMAL HIGH (ref 70–99)
HCT: 42 % (ref 39.0–52.0)
Hemoglobin: 14.3 g/dL (ref 13.0–17.0)
Potassium: 4 mmol/L (ref 3.5–5.1)
Sodium: 139 mmol/L (ref 135–145)
TCO2: 28 mmol/L (ref 22–32)

## 2020-09-01 MED ORDER — DICLOFENAC SODIUM ER 100 MG PO TB24: 100.0000 mg | ORAL_TABLET | Freq: Every day | ORAL | 0 refills | Status: DC

## 2020-09-01 MED ORDER — LIDOCAINE 5 % EX PTCH
3.0000 | MEDICATED_PATCH | CUTANEOUS | Status: DC
  Administered 2020-09-01: 3 via TRANSDERMAL
  Filled 2020-09-01: qty 3

## 2020-09-01 MED ORDER — LIDOCAINE 5 % EX PTCH: 1.0000 | MEDICATED_PATCH | CUTANEOUS | 0 refills | Status: DC

## 2020-09-01 MED ORDER — KETOROLAC TROMETHAMINE 60 MG/2ML IM SOLN
30.0000 mg | Freq: Once | INTRAMUSCULAR | Status: AC
  Administered 2020-09-01: 30 mg via INTRAMUSCULAR
  Filled 2020-09-01: qty 2

## 2020-09-01 MED ORDER — SODIUM CHLORIDE 0.9 % IV BOLUS
500.0000 mL | Freq: Once | INTRAVENOUS | Status: AC
  Administered 2020-09-01: 500 mL via INTRAVENOUS

## 2020-09-01 NOTE — ED Provider Notes (Addendum)
[facility] EMERGENCY DEPARTMENT Provider Note   CSN: [id number] Arrival date & time: 09/01/20  0865     History No chief complaint on file.   Ranier Coach is a 50 y.o. male.  The history is provided by the patient. The history is limited by a language barrier (ASL interpretor used ). No language interpreter was used.  Leg Pain Location:  Leg Time since incident:  1 day Injury: yes (straddled a fence on blood thinners )   Leg location:  L upper leg Pain details:    Quality:  Aching   Radiates to:  Does not radiate   Severity:  Severe   Onset quality:  Sudden   Duration:  1 day   Timing:  Constant   Progression:  Unchanged Chronicity:  New Dislocation: no   Prior injury to area:  No Relieved by:  Nothing Worsened by:  Nothing Associated symptoms: no fever   Patient straddled a fence and then took a friend's DOAC and now has sudden swelling and bruising of the left thigh.  No pain nor swelling prior to that      Past Medical History:  Diagnosis Date  . Anxiety   . Deaf   . Depression     Patient Active Problem List   Diagnosis Date Noted  . Moderate episode of recurrent major depressive disorder (HCC) 07/10/2020  . Generalized anxiety disorder 07/10/2020  . Agitation 07/10/2020    Past Surgical History:  Procedure Laterality Date  . BICEPT TENODESIS Left 06/20/2019   Procedure: BICEPS TENODESIS;  Surgeon: Bjorn Pippin, MD;  Location: [facility];  Service: Orthopedics;  Laterality: Left;  . NOSE SURGERY    . ROTATOR CUFF REPAIR Left        Family History  Problem Relation Age of Onset  . Healthy Mother   . Drug abuse Mother   . Healthy Father   . Drug abuse Sister   . Drug abuse Maternal Uncle     Social History   Tobacco Use  . Smoking status: Current Every Day Smoker    Packs/day: 0.50    Types: Cigarettes  . Smokeless tobacco: Never Used  Substance Use Topics  . Alcohol use: Not Currently  . Drug use:  Yes    Types: Methamphetamines    Home Medications Prior to Admission medications   Medication Sig Start Date End Date Taking? Authorizing Provider  ARIPiprazole (ABILIFY) 5 MG tablet Take 1 tablet (5 mg total) by mouth daily. 07/10/20 07/10/21  Nwoko, Tommas Olp, PA  cycloSPORINE (RESTASIS) 0.05 % ophthalmic emulsion Place 1 drop into both eyes at bedtime. 07/15/20   Lamptey, Britta Mccreedy, MD  gabapentin (NEURONTIN) 300 MG capsule Take 1 capsule (300 mg total) by mouth 3 (three) times daily. 07/13/20   Claiborne Rigg, NP  hydroxypropyl methylcellulose / hypromellose (ISOPTO TEARS / GONIOVISC) 2.5 % ophthalmic solution Place 2 drops into both eyes as needed for dry eyes. 07/10/20   Merrilee Jansky, MD  sertraline (ZOLOFT) 50 MG tablet Take 1 tablet (50 mg total) by mouth daily. Take half tablet (25 mg total) for first week. Patient to continue taking full tablet (50 mg total) daily at the start of the second week. 07/10/20 07/10/21  Meta Hatchet, PA    Allergies    Shrimp [shellfish allergy]  Review of Systems   Review of Systems  Constitutional: Negative for fever.  HENT: Negative for congestion.   Eyes: Negative for visual disturbance.  Respiratory: Negative for shortness of breath.   Cardiovascular: Negative for chest pain.  Gastrointestinal: Negative for abdominal pain.  Genitourinary: Negative for difficulty urinating.  Musculoskeletal: Positive for arthralgias.  Skin: Negative for rash.  Neurological: Negative for dizziness.  Psychiatric/Behavioral: Negative for agitation.  All other systems reviewed and are negative.   Physical Exam Updated Vital Signs BP (!) 104/59   Pulse (!) 117   Temp 98.8 F (37.1 C)   Resp 18   SpO2 95%   Physical Exam Vitals and nursing note reviewed.  Constitutional:      General: He is not in acute distress.    Appearance: Normal appearance.  HENT:     Head: Normocephalic and atraumatic.     Nose: Nose normal.  Eyes:      Conjunctiva/sclera: Conjunctivae normal.     Pupils: Pupils are equal, round, and reactive to light.  Cardiovascular:     Rate and Rhythm: Normal rate and regular rhythm.     Pulses: Normal pulses.     Heart sounds: Normal heart sounds.  Pulmonary:     Effort: Pulmonary effort is normal.     Breath sounds: Normal breath sounds.  Abdominal:     General: Abdomen is flat. Bowel sounds are normal.     Palpations: Abdomen is soft.     Tenderness: There is no abdominal tenderness. There is no guarding.  Musculoskeletal:     Cervical back: Normal range of motion and neck supple.       Legs:  Skin:    General: Skin is warm and dry.     Capillary Refill: Capillary refill takes less than 2 seconds.  Neurological:     General: No focal deficit present.     Mental Status: He is alert and oriented to person, place, and time.     Deep Tendon Reflexes: Reflexes normal.  Psychiatric:        Mood and Affect: Mood normal.        Behavior: Behavior normal.     ED Results / Procedures / Treatments   Labs (all labs ordered are listed, but only abnormal results are displayed) Labs Reviewed  CBC WITH DIFFERENTIAL/PLATELET  I-STAT CHEM 8, ED    EKG None  Radiology CT EXTREMITY LOWER LEFT WO CONTRAST  Result Date: 09/01/2020 CLINICAL DATA:  Left thigh pain, swelling, and bruising since last night. EXAM: CT OF THE LOWER LEFT EXTREMITY WITHOUT CONTRAST TECHNIQUE: Multidetector CT imaging of the lower left extremity was performed according to the standard protocol. COMPARISON:  None. FINDINGS: Bones/Joint/Cartilage No fracture or dislocation. Joint spaces are preserved. No joint effusion. Ligaments Ligaments are suboptimally evaluated by CT. Muscles and Tendons 7.4 x 5.3 x 9.8 cm heterogeneous mass in the adductor compartment of the medial left thigh with peripheral hyperdensity in central hypodensity. No muscle atrophy. Soft tissue Prominent superficial soft tissue swelling in the medial upper  thigh. IMPRESSION: 1. 7.4 x 5.3 x 9.8 cm heterogeneous mass in the adductor compartment of the medial left thigh, possibly hematoma given clinical history. However, in the absence of acute injury or blood thinners, an underlying solid neoplasm that bled is not excluded. Recommend MRI of the left thigh with and without contrast for further evaluation. Abscess is considered less likely in the absence of signs or symptoms of infection. 2. No acute osseous abnormality. Electronically Signed   By: Obie DredgeWilliam T Derry M.D.   On: 09/01/2020 17:21   VAS US LOWER EXTREMITY VENOUS (DVT) ([facility] and [facility] 7a-7p)  Result Date: 09/01/2020  Lower Venous DVT Study Patient Name:  CHI GARLOW  Date of Exam:   09/01/2020 Medical Rec #: [id number]       Accession #:    [id number] Date of Birth: 1970/07/09        Patient Gender: M Patient Age:   89Y Exam Location:  [facility] Procedure:      VAS [place] LOWER EXTREMITY VENOUS (DVT) Referring Phys: [id number] SCOTT GOLDSTON --------------------------------------------------------------------------------  Indications: Swelling, and Edema.  Comparison Study: no prior Performing Technologist: Blanch Media RVS  Examination Guidelines: A complete evaluation includes B-mode imaging, spectral Doppler, color Doppler, and power Doppler as needed of all accessible portions of each vessel. Bilateral testing is considered an integral part of a complete examination. Limited examinations for reoccurring indications may be performed as noted. The reflux portion of the exam is performed with the patient in reverse Trendelenburg.  +-----+---------------+---------+-----------+----------+--------------+ RIGHTCompressibilityPhasicitySpontaneityPropertiesThrombus Aging +-----+---------------+---------+-----------+----------+--------------+ CFV  Full           Yes      Yes                                 +-----+---------------+---------+-----------+----------+--------------+    +---------+---------------+---------+-----------+----------+--------------+ LEFT     CompressibilityPhasicitySpontaneityPropertiesThrombus Aging +---------+---------------+---------+-----------+----------+--------------+ CFV      Full           Yes      Yes                                 +---------+---------------+---------+-----------+----------+--------------+ SFJ      Full                                                        +---------+---------------+---------+-----------+----------+--------------+ FV Prox  Full                                                        +---------+---------------+---------+-----------+----------+--------------+ FV Mid   Full                                                        +---------+---------------+---------+-----------+----------+--------------+ FV DistalFull                                                        +---------+---------------+---------+-----------+----------+--------------+ PFV      Full                                                        +---------+---------------+---------+-----------+----------+--------------+ POP      Full  Yes      Yes                                 +---------+---------------+---------+-----------+----------+--------------+ PTV      Full                                                        +---------+---------------+---------+-----------+----------+--------------+ PERO     Full                                                        +---------+---------------+---------+-----------+----------+--------------+   Left Technical Findings:   Summary: RIGHT: - No evidence of common femoral vein obstruction.  LEFT: - There is no evidence of deep vein thrombosis in the lower extremity.  - No cystic structure found in the popliteal fossa. - Heterogeneous area in the proximal medial left thigh measuring 7.95cm x 5.40cm. Etiology unknown further testing may be  warranted.  *See table(s) above for measurements and observations. Electronically signed by Waverly Ferrari MD on 09/01/2020 at 3:43:42 PM.    Final     Procedures Procedures   Medications Ordered in ED Medications  lidocaine (LIDODERM) 5 % 3 patch (3 patches Transdermal Patch Applied 09/01/20 1558)  ketorolac (TORADOL) injection 30 mg (30 mg Intramuscular Given 09/01/20 1557)    ED Course  I have reviewed the triage vital signs and the nursing notes.  Pertinent labs & imaging results that were available during my care of the patient were reviewed by me and considered in my medical decision making (see chart for details).  I Do not believe this is a mass.  This was sudden onset post trauma and taking DOACs.  Stop DOACs, ice and compress.  He was told this using ASL interpretor.    I spoke with Dr. Llana Aliment who also did not feel this was a mass but a 140 cc hematoma.    The patient has repeatedly asked for hydrocodone by name.  I suspect there is some drug seeking.    Kalil Woessner was evaluated in Emergency Department on 09/01/2020 for the symptoms described in the history of present illness. He was evaluated in the context of the global COVID-19 pandemic, which necessitated consideration that the patient might be at risk for infection with the SARS-CoV-2 virus that causes COVID-19. Institutional protocols and algorithms that pertain to the evaluation of patients at risk for COVID-19 are in a state of rapid change based on information released by regulatory bodies including the CDC and federal and state organizations. These policies and algorithms were followed during the patient's care in the ED.  Final Clinical Impression(s) / ED Diagnoses Return for intractable cough, coughing up blood, fevers >100.4 unrelieved by medication, shortness of breath, intractable vomiting, chest pain, shortness of breath, weakness, numbness, changes in speech, facial asymmetry, abdominal pain, passing out,  Inability to tolerate liquids or food, cough, altered mental status or any concerns. No signs of systemic illness or infection. The patient is nontoxic-appearing on exam and vital signs are within normal limits.  I have reviewed the triage vital signs and the nursing notes.  Pertinent labs & imaging results that were available during my care of the patient were reviewed by me and considered in my medical decision making (see chart for details). After history, exam, and medical workup I feel the patient has been appropriately medically screened and is safe for discharge home. Pertinent diagnoses were discussed with the patient. Patient was given return precautions.      Hellen Shanley, MD 09/01/20 Ronita Hipps, Kyilee Gregg, MD 09/01/20 1933

## 2020-09-01 NOTE — Progress Notes (Signed)
Lower extremity venous has been completed.   Preliminary results in CV Proc.   Blanch Media 09/01/2020 2:58 PM

## 2020-09-01 NOTE — ED Triage Notes (Signed)
Pt reports pain, bruising, and swelling to L upper leg since last night. Denies injury. Reports lower leg pain last week, but then this swelling began last night. Just drove to the beach and back this week.

## 2020-09-03 ENCOUNTER — Other Ambulatory Visit: Payer: Self-pay | Admitting: Nurse Practitioner

## 2020-09-03 ENCOUNTER — Ambulatory Visit: Payer: Medicare Other | Attending: Nurse Practitioner | Admitting: Audiology

## 2020-09-03 ENCOUNTER — Other Ambulatory Visit: Payer: Self-pay

## 2020-09-03 DIAGNOSIS — H903 Sensorineural hearing loss, bilateral: Secondary | ICD-10-CM | POA: Insufficient documentation

## 2020-09-03 DIAGNOSIS — H9190 Unspecified hearing loss, unspecified ear: Secondary | ICD-10-CM

## 2020-09-03 NOTE — Procedures (Signed)
  Outpatient Audiology and [facility] [address] [place], [place]  [address] [phone]  AUDIOLOGICAL  EVALUATION  NAME: Carl Skinner     DOB:   07-15-1970      MRN: [id number]                                                                                     DATE: 09/03/2020     REFERENT: Claiborne Rigg, NP STATUS: Outpatient DIAGNOSIS: Sensorineural hearing loss, bilateral  History: Carl Skinner was seen for an audiological evaluation. Carl Skinner was accompanied to the appointment by an Research officer, political party. Carl Skinner is Deaf and has a known severe to profound sensorineural hearing loss in both ears. Carl Skinner previously utilized hearing aids and is interested in pursuing new hearing aids.   Evaluation:   Otoscopy showed a clear view of the tympanic membranes, bilaterally  Tympanometry results were consistent with normal middle ear pressure and normal tympanic membrane mobility, bilaterally.   Audiometric testing was completed using Conventional Audiometry techniques with insert earphones and TDH headphones. Test results are consistent with a severe to profound sensorineural hearing loss, bilaterally. Speech Detection Thresholds were obtained at 85 dB HL in the right ear and at 85 dB HL in the left ear. Word Recognition Testing was not completed due to the severity of Carl Skinner's hearing loss.   Results:  The test results and recommendations were reviewed with Carl Skinner via the ASL interpreter. Today's test results are consistent with a severe to profound sensorineural hearing loss, bilaterally. Carl Skinner will have communication difficulty in many listening environments. Carl Skinner is interested in pursuing amplification. Carl Skinner has [employer] and it was recommended for Carl Skinner to have a hearing aid consultation at [facility] due to his insurance.   Recommendations: 1. Referral to [facility] Audiology Department for a  Hearing Aid Consultation.      Marton Redwood Audiologist, Au.D., CCC-A 09/03/2020  3:24 PM  Cc: Claiborne Rigg, NP

## 2020-09-04 ENCOUNTER — Telehealth (HOSPITAL_COMMUNITY): Payer: Self-pay | Admitting: *Deleted

## 2020-09-04 ENCOUNTER — Other Ambulatory Visit (HOSPITAL_COMMUNITY): Payer: Self-pay | Admitting: Physician Assistant

## 2020-09-04 DIAGNOSIS — F331 Major depressive disorder, recurrent, moderate: Secondary | ICD-10-CM

## 2020-09-04 DIAGNOSIS — F411 Generalized anxiety disorder: Secondary | ICD-10-CM

## 2020-09-04 MED ORDER — SERTRALINE HCL 50 MG PO TABS: 50.0000 mg | ORAL_TABLET | Freq: Every day | ORAL | 1 refills | Status: DC

## 2020-09-04 NOTE — Progress Notes (Signed)
Provider contacted Wynona Luna, RN regarding patient's medication refill request. Patient's medication to be e-prescribed to pharmacy of choice.

## 2020-09-04 NOTE — Telephone Encounter (Signed)
Pharmacy request for patients Zoloft [facility] in [place]. I called the pharmacy and he filled last rx there all others have been filled in [place]. He should be out of Zoloft and I see in the chart he was seen in a local ED in the last few days. Will ask his provider to call rx to [facility].

## 2020-09-04 NOTE — Telephone Encounter (Signed)
Provider contacted Carl K Beck, RN regarding patient's medication refill request. Patient's medication to be e-prescribed to pharmacy of choice. 

## 2020-09-07 NOTE — Telephone Encounter (Signed)
Opened in error

## 2020-09-23 ENCOUNTER — Encounter: Payer: Self-pay | Admitting: Nurse Practitioner

## 2020-09-23 ENCOUNTER — Ambulatory Visit: Payer: Medicare Other | Attending: Nurse Practitioner | Admitting: Nurse Practitioner

## 2020-09-23 ENCOUNTER — Other Ambulatory Visit: Payer: Self-pay

## 2020-09-23 DIAGNOSIS — F331 Major depressive disorder, recurrent, moderate: Secondary | ICD-10-CM

## 2020-09-23 DIAGNOSIS — F411 Generalized anxiety disorder: Secondary | ICD-10-CM | POA: Diagnosis not present

## 2020-09-23 MED ORDER — QUETIAPINE FUMARATE 50 MG PO TABS: 25.0000 mg | ORAL_TABLET | Freq: Every day | ORAL | 1 refills | Status: DC

## 2020-09-23 MED ORDER — GABAPENTIN 600 MG PO TABS: 600.0000 mg | ORAL_TABLET | Freq: Three times a day (TID) | ORAL | 1 refills | Status: DC

## 2020-09-23 NOTE — Progress Notes (Signed)
Virtual Visit via Telephone Note Due to national recommendations of social distancing due to COVID 19, telehealth visit is felt to be most appropriate for this patient at this time.  I discussed the limitations, risks, security and privacy concerns of performing an evaluation and management service by telephone and the availability of in person appointments. I also discussed with the patient that there may be a patient responsible charge related to this service. The patient expressed understanding and agreed to proceed.    I connected with Gatha Mayer on 09/23/20  at   3:50 PM EDT  EDT by telephone and verified that I am speaking with the correct person using two identifiers.  Location of Patient: Private Residence   Location of Provider: [facility]    Persons participating in Telemedicine visit: Bertram Denver FNP-BC Gatha Mayer  SIGN LANGUAGE INTERPRETER VIA ASL SYSTEM   History of Present Illness: Telemedicine visit for: Anxiety and Depression  has a past medical history of Anxiety, Deaf, and Depression.  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    Denies any back pain today but is requesting to increase his gabapentin to 600 mg TID. States the white ones work better than the yellow ones.   Was prescribed zoloft 50 a few months ago which today he reports as being ineffective. Requesting valium which I have instructed him that I do not prescribe. Tried duloxetine but had side effects. Will try seroquel at this time and refer for psychotherapy. He does not endorse any thoughts of self harm.  He was seen in the ED a few weeks ago for traumatic hematoma to the left anterior thigh. PER ED Note: Patient straddled a fence and then took a  friend's DOAC and now has sudden swelling and bruising of the left thigh.  No pain nor swelling prior to that  Today he is requesting a possible medication that can be prescribed for muscle relaxing. I have instructed him to use voltaren gel or heating pad. He states he is heat sensitive on his legs and voltaren does not work for him.   Past Medical History:  Diagnosis Date  . Anxiety   . Deaf   . Depression     Past Surgical History:  Procedure Laterality Date  . BICEPT TENODESIS Left 06/20/2019   Procedure: BICEPS TENODESIS;  Surgeon: Bjorn Pippin, MD;  Location: [facility];  Service: Orthopedics;  Laterality: Left;  . NOSE SURGERY    . ROTATOR CUFF REPAIR Left     Family History  Problem Relation Age of Onset  . Healthy Mother   . Drug abuse Mother   . Healthy Father   . Drug abuse Sister   . Drug abuse Maternal Uncle     Social History   Socioeconomic History  . Marital status: Unknown    Spouse name: Not on file  . Number of children: Not on file  . Years of education: Not on file  . Highest education level: Not on file  Occupational History  . Not on file  Tobacco Use  . Smoking status: Current Every Day Smoker    Packs/day: 0.50    Types: Cigarettes  . Smokeless tobacco: Never Used  Substance and Sexual Activity  . Alcohol use: Not Currently  .  Drug use: Yes    Types: Methamphetamines  . Sexual activity: Not on file  Other Topics Concern  . Not on file  Social History Narrative  . Not on file   Social Determinants of Health   Financial Resource Strain: Not on file  Food Insecurity: Not on file  Transportation Needs: Not on file  Physical Activity: Not on file  Stress: Not on file  Social Connections: Not on file     Observations/Objective: Awake, alert and oriented x 3   Review of Systems  Constitutional: Negative for fever, malaise/fatigue and weight loss.  HENT: Negative.  Negative for nosebleeds.   Eyes: Negative.  Negative for  blurred vision, double vision and photophobia.  Respiratory: Negative.  Negative for cough and shortness of breath.   Cardiovascular: Negative.  Negative for chest pain, palpitations and leg swelling.  Gastrointestinal: Negative.  Negative for heartburn, nausea and vomiting.  Musculoskeletal: Positive for myalgias.  Neurological: Negative.  Negative for dizziness, focal weakness, seizures and headaches.  Psychiatric/Behavioral: Positive for depression. Negative for suicidal ideas. The patient is nervous/anxious.     Assessment and Plan: Diagnoses and all orders for this visit:  Moderate episode of recurrent major depressive disorder (HCC) -     QUEtiapine (SEROQUEL) 50 MG tablet; Take 0.5-1 tablets (25-50 mg total) by mouth at bedtime. Start with taking 1/2 tablet daily for 3 weeks. After 3 weeks if no improvement in symptoms may increase to 50 mg or one whole tablet daily by mouth. -     gabapentin (NEURONTIN) 600 MG tablet; Take 1 tablet (600 mg total) by mouth 3 (three) times daily. -     Ambulatory referral to Integrated Behavioral Health  Generalized anxiety disorder -     QUEtiapine (SEROQUEL) 50 MG tablet; Take 0.5-1 tablets (25-50 mg total) by mouth at bedtime. Start with taking 1/2 tablet daily for 3 weeks. After 3 weeks if no improvement in symptoms may increase to 50 mg or one whole tablet daily by mouth. -     gabapentin (NEURONTIN) 600 MG tablet; Take 1 tablet (600 mg total) by mouth 3 (three) times daily. -     Ambulatory referral to Integrated Behavioral Health     Follow Up Instructions Return in about 8 weeks (around 11/18/2020) for anxiety and deperession .     I discussed the assessment and treatment plan with the patient. The patient was provided an opportunity to ask questions and all were answered. The patient agreed with the plan and demonstrated an understanding of the instructions.   The patient was advised to call back or seek an in-person evaluation if the  symptoms worsen or if the condition fails to improve as anticipated.  I provided 15 minutes of non-face-to-face time during this encounter including median intraservice time, reviewing previous notes, labs, imaging, medications and explaining diagnosis and management.  Claiborne Rigg, FNP-BC

## 2020-09-28 ENCOUNTER — Encounter: Payer: Self-pay | Admitting: Nurse Practitioner

## 2020-09-28 ENCOUNTER — Telehealth: Payer: Self-pay | Admitting: Nurse Practitioner

## 2020-09-28 NOTE — Telephone Encounter (Signed)
Copied from CRM [id number]. Topic: Referral - Question >> Sep 28, 2020  1:24 PM Pawlus, Maxine Glenn A wrote: Reason for CRM: Pt is requesting a referral for a dentist, pt wanted to know if Zelda could refer him to a dentist that accepts medicaid, Pt is deaf and requested an answer via MyChart or text message.

## 2020-10-13 ENCOUNTER — Telehealth (HOSPITAL_COMMUNITY): Payer: Self-pay | Admitting: Physician Assistant

## 2020-10-13 ENCOUNTER — Other Ambulatory Visit (HOSPITAL_COMMUNITY): Payer: Self-pay | Admitting: Physician Assistant

## 2020-10-13 DIAGNOSIS — F331 Major depressive disorder, recurrent, moderate: Secondary | ICD-10-CM

## 2020-10-13 DIAGNOSIS — F411 Generalized anxiety disorder: Secondary | ICD-10-CM

## 2020-10-13 MED ORDER — SERTRALINE HCL 50 MG PO TABS
50.0000 mg | ORAL_TABLET | Freq: Every day | ORAL | 1 refills | Status: DC
Start: 2020-10-13 — End: 2021-11-15

## 2020-10-13 NOTE — Telephone Encounter (Signed)
Provider was contacted by Carl Skinner regarding patient's medication refill. Patient's Zoloft to be e-prescribed to pharmacy of choice.

## 2020-10-13 NOTE — Progress Notes (Signed)
Provider was contacted by Lauralee Evener regarding patient's medication refill. Patient's Zoloft to be e-prescribed to pharmacy of choice.  Provider is unaware of patient starting Seroquel 50 mg daily. Per chart review, it appears that patient was started on Seroquel by Claiborne Rigg, NP. Patient's next appointment is scheduled for 11/19/2020.

## 2020-10-13 NOTE — Telephone Encounter (Signed)
Patient calling requesting ZOLOFT again. Pt reports (Quetiapine) Seroquel 50 mg is too strong so unable to take dose during the day. Pt voiced concern of having no medication for day time. Please advise.  Pt will fu at appt 11/19/20, but [place]. Please call pt at [phone]

## 2020-10-15 ENCOUNTER — Other Ambulatory Visit: Payer: Self-pay | Admitting: Nurse Practitioner

## 2020-10-15 DIAGNOSIS — F331 Major depressive disorder, recurrent, moderate: Secondary | ICD-10-CM

## 2020-10-15 DIAGNOSIS — F411 Generalized anxiety disorder: Secondary | ICD-10-CM

## 2020-10-15 NOTE — Telephone Encounter (Signed)
   Notes to clinic:  REQUEST FOR 90 DAYS PRESCRIPTION.   Requested Prescriptions  Pending Prescriptions Disp Refills   QUEtiapine (SEROQUEL) 50 MG tablet [Pharmacy Med Name: QUETIAPINE FUMARATE 50 MG TAB] 90 tablet 1    Sig: Take 0.5-1 tablets (25-50 mg total) by mouth at bedtime. Start with taking 1/2 tablet daily for 3 weeks. After 3 weeks if no improvement in symptoms may increase to 50 mg or one whole tablet daily by mouth.      Not Delegated - Psychiatry:  Antipsychotics - Second Generation (Atypical) - quetiapine Failed - 10/15/2020  2:31 PM      Failed - This refill cannot be delegated      Passed - ALT in normal range and within 180 days    ALT  Date Value Ref Range Status  07/13/2020 25 0 - 44 IU/L Final          Passed - AST in normal range and within 180 days    AST  Date Value Ref Range Status  07/13/2020 18 0 - 40 IU/L Final          Passed - Completed PHQ-2 or PHQ-9 in the last 360 days      Passed - Last BP in normal range    BP Readings from Last 1 Encounters:  09/01/20 131/79          Passed - Valid encounter within last 6 months    Recent Outpatient Visits           3 weeks ago Moderate episode of recurrent major depressive disorder [facility])   [place] [place], [place] W, NP   3 months ago Hearing loss, unspecified hearing loss type, unspecified laterality   [facility] [address] [facility] [place], Shea Stakes, NP       Future Appointments             In 1 month Claiborne Rigg, NP [facility]

## 2020-11-13 ENCOUNTER — Telehealth (HOSPITAL_COMMUNITY): Payer: Self-pay | Admitting: *Deleted

## 2020-11-13 ENCOUNTER — Telehealth (INDEPENDENT_AMBULATORY_CARE_PROVIDER_SITE_OTHER): Payer: Self-pay

## 2020-11-13 NOTE — Telephone Encounter (Signed)
Received a 90 day rx request for his zoloft. He has not been seen by a provider since the end of March and has already cancelled his Aug appt here with the provider. [facility] [place]. Not sure why there would be a request for an rx in [place]. Additionally he has a refill available for his ZOloft on a previous Rx.

## 2020-11-13 NOTE — Telephone Encounter (Signed)
Unable to reach patient to schedule appointment. Maryjean Morn, CMA     Copied from CRM [id number]. Topic: Referral - Question >> Oct 23, 2020 11:49 AM Gaetana Michaelis A wrote: Reason for CRM: Patient needs additional assistance coordinating the referrals needed to have a conductive hearing aid evaluation   Patient has been screened by an audiologist   The patient has been told that they will need additional coordination from their PCP regarding a referral or orders for the actual hearing aid to be made  Please contact to further advise when possible

## 2020-11-18 ENCOUNTER — Other Ambulatory Visit: Payer: Self-pay

## 2020-11-18 ENCOUNTER — Ambulatory Visit: Payer: Medicare Other | Attending: Nurse Practitioner | Admitting: Nurse Practitioner

## 2020-11-19 ENCOUNTER — Encounter (HOSPITAL_COMMUNITY): Payer: Medicare Other | Admitting: Physician Assistant

## 2020-11-19 ENCOUNTER — Telehealth: Payer: Self-pay | Admitting: Nurse Practitioner

## 2020-11-19 NOTE — Telephone Encounter (Signed)
Medication Management: Pt called in stating this medication is not helping him, and wanted to see about getting put on something else. Please advise.  sertraline (ZOLOFT) 50 MG tablet   Has the patient contacted their pharmacy? No.  Preferred Pharmacy (with phone number or street name):  [id number] [place] [place] - [address] AT [address]OSS FROM [facility] [address] Phone:  [phone]  Fax:  [phone]      Agent: Please be advised that RX refills may take up to 3 business days. We ask that you follow-up with your pharmacy.

## 2020-11-20 NOTE — Telephone Encounter (Signed)
Thank you :)

## 2020-11-20 NOTE — Telephone Encounter (Signed)
Pt sees a behavioral health specialist. A decision to switch medications because of lack of benefit should be made by that provider.

## 2020-12-13 ENCOUNTER — Other Ambulatory Visit (HOSPITAL_COMMUNITY): Payer: Self-pay | Admitting: Physician Assistant

## 2020-12-13 DIAGNOSIS — F331 Major depressive disorder, recurrent, moderate: Secondary | ICD-10-CM

## 2020-12-13 DIAGNOSIS — F411 Generalized anxiety disorder: Secondary | ICD-10-CM

## 2021-01-14 ENCOUNTER — Other Ambulatory Visit (HOSPITAL_COMMUNITY): Payer: Self-pay | Admitting: Physician Assistant

## 2021-01-14 DIAGNOSIS — F331 Major depressive disorder, recurrent, moderate: Secondary | ICD-10-CM

## 2021-01-14 DIAGNOSIS — F411 Generalized anxiety disorder: Secondary | ICD-10-CM

## 2021-01-26 ENCOUNTER — Telehealth: Payer: Self-pay | Admitting: Nurse Practitioner

## 2021-01-26 NOTE — Telephone Encounter (Signed)
Copied from [place] [phone]. Topic: Referral - Request for Referral >> Jan 26, 2021  1:19 PM Leward Quan A wrote: Has patient seen PCP for this complaint? Yes.   *If NO, is insurance requiring patient see PCP for this issue before PCP can refer them? Referral for which specialty: Hearing  Preferred provider/office: [facility] in [place] [address] Reason for referral: New hearing aide

## 2021-01-27 ENCOUNTER — Other Ambulatory Visit: Payer: Self-pay | Admitting: Nurse Practitioner

## 2021-01-27 DIAGNOSIS — H9190 Unspecified hearing loss, unspecified ear: Secondary | ICD-10-CM

## 2021-01-27 NOTE — Telephone Encounter (Signed)
Copied from [place] [phone]. Topic: Referral - Request for Referral >> Jan 27, 2021  3:03 PM Tessa Lerner A wrote: Has patient seen PCP for this complaint? Yes.    *If NO, is insurance requiring patient see PCP for this issue before PCP can refer them?  Referral for which specialty: Otolaryngology   Preferred provider/office: [facility], [place], [place], [place], [place] [address]  [phone]  Reason for referral: Patient needs hearing aids

## 2021-01-28 NOTE — Telephone Encounter (Signed)
Patient has not been seen since first encounter. Patient must schedule an appointment in order to continue receiving refills.

## 2021-02-17 ENCOUNTER — Telehealth: Payer: Self-pay | Admitting: *Deleted

## 2021-02-17 NOTE — Telephone Encounter (Signed)
Copied from CRM [phone] [id number]. Topic: General - Other >> Feb 10, 2021 12:00 PM Gaetana Michaelis A wrote: Reason for CRM: The patient has called to request that their referral/order for hearing aids be sent to [facility] [phone]  The patient has stressed the urgency of this request and shares that they've previously reached out about this request

## 2021-02-22 NOTE — Telephone Encounter (Signed)
Called the pt and home number is not a working number, and cell phone does not give an option to leave a vm.

## 2021-05-31 ENCOUNTER — Ambulatory Visit (HOSPITAL_COMMUNITY)
Admission: EM | Admit: 2021-05-31 | Discharge: 2021-05-31 | Disposition: A | Discharge status: Home / Self Care | Payer: Medicare Other | Attending: Emergency Medicine | Admitting: Emergency Medicine

## 2021-05-31 ENCOUNTER — Other Ambulatory Visit: Payer: Self-pay

## 2021-05-31 ENCOUNTER — Ambulatory Visit (INDEPENDENT_AMBULATORY_CARE_PROVIDER_SITE_OTHER): Payer: Medicare Other

## 2021-05-31 ENCOUNTER — Encounter (HOSPITAL_COMMUNITY): Payer: Self-pay

## 2021-05-31 DIAGNOSIS — L089 Local infection of the skin and subcutaneous tissue, unspecified: Secondary | ICD-10-CM | POA: Diagnosis not present

## 2021-05-31 DIAGNOSIS — S62663G Nondisplaced fracture of distal phalanx of left middle finger, subsequent encounter for fracture with delayed healing: Secondary | ICD-10-CM | POA: Diagnosis not present

## 2021-05-31 MED ORDER — HYDROCODONE-ACETAMINOPHEN 5-325 MG PO TABS: 2.0000 | ORAL_TABLET | ORAL | 0 refills | Status: DC | PRN

## 2021-05-31 MED ORDER — BACITRACIN ZINC 500 UNIT/GM EX OINT
TOPICAL_OINTMENT | CUTANEOUS | Status: AC
  Filled 2021-05-31: qty 4.5

## 2021-05-31 MED ORDER — BACITRACIN ZINC 500 UNIT/GM EX OINT
TOPICAL_OINTMENT | CUTANEOUS | Status: AC
  Filled 2021-05-31: qty 0.9

## 2021-05-31 MED ORDER — CEPHALEXIN 500 MG PO CAPS: 500.0000 mg | ORAL_CAPSULE | Freq: Four times a day (QID) | ORAL | 0 refills | Status: AC
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
## 2021-05-31 NOTE — Discharge Instructions (Signed)
Please keep your finger splinted is much as possible for the next 4 weeks.  It is okay to remove the splint to shower or prepare food.  Please begin taking Keflex, 1 capsule 4 times daily for the next 7 days to resolve any possible soft tissue infection at the tip of your left middle finger.  I have also provided you with 10 hydrocodone tablets for pain.  If, after 4 weeks, your pain has not significantly improved, recommend that you follow-up with orthopedics for further evaluation and possible treatment.  Thank you for visiting urgent care today.

## 2021-05-31 NOTE — ED Triage Notes (Signed)
Pt presents with complaints of laceration to right forearm after falling last week and cutting his arm on a tree.   Pt also reports he hurt his left middle finger 2 weeks ago. He had stitches placed but it is still hurting.

## 2021-06-04 ENCOUNTER — Telehealth: Payer: Self-pay | Admitting: Emergency Medicine

## 2021-06-04 MED ORDER — HYDROCODONE-ACETAMINOPHEN 7.5-325 MG PO TABS: 1.0000 | ORAL_TABLET | Freq: Four times a day (QID) | ORAL | 0 refills | Status: AC | PRN

## 2021-06-04 NOTE — Telephone Encounter (Addendum)
Patient contacted urgent care to let us know that there is a Set designer 5-325.  [facility] [address] [facility], which [facility].  I also asked the pharmacist to discontinue my previous prescription for Norco 5-325.  PDMP again reviewed by me today.  Patient was provided with a new prescription for Norco 7.5-325, 12 tablets.

## 2021-06-19 IMAGING — CT CT EXTREM LOW W/O CM*L*
2 of 3 series · 12 of 34 positions shown, 15 images · non-contrast
Comparison: None.

CLINICAL DATA: Left thigh pain, swelling, and bruising since last
night.

EXAM:
CT OF THE LOWER LEFT EXTREMITY WITHOUT CONTRAST
TECHNIQUE: Multidetector CT imaging of the lower left extremity was performed
according to the standard protocol.

[Series 4: lfov ext 3.0 b40s · axial · 0.56mm/px · z∈[+596,+1058]mm · 9 of 184 slices shown, 12 images]
[im 15/184  soft-tissue]
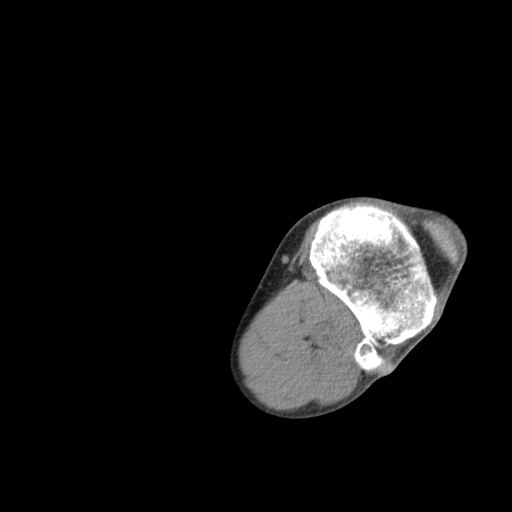
[im 15/184  bone]
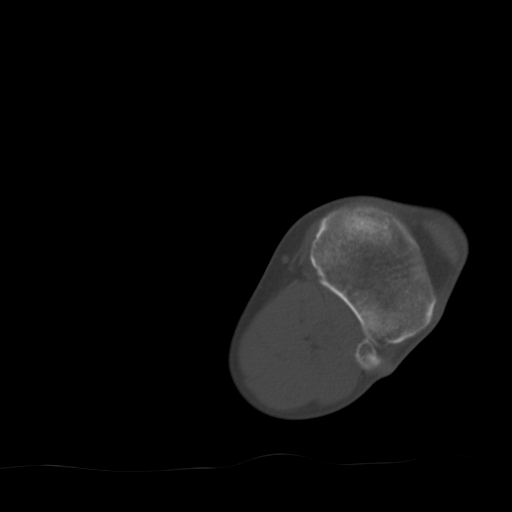
[im 43/184  bone]
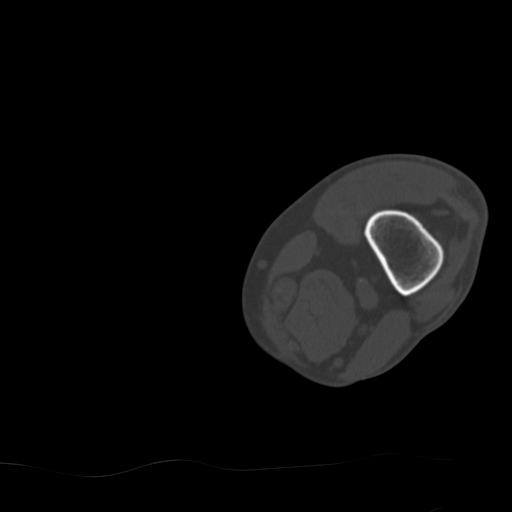
[im 57/184  bone]
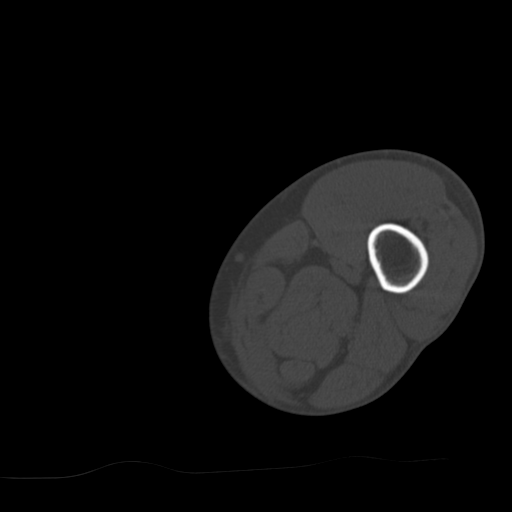
[im 71/184  bone]
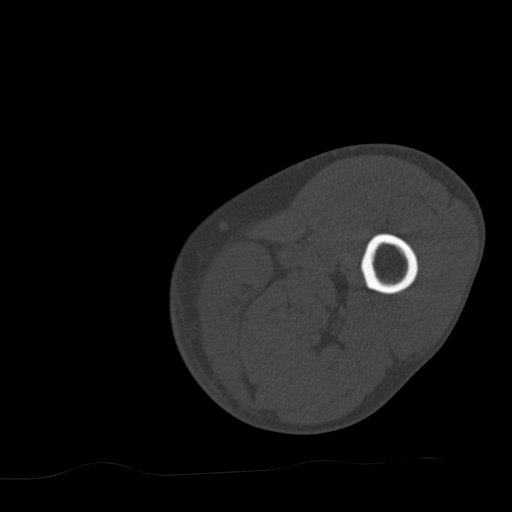
[im 99/184  soft-tissue]
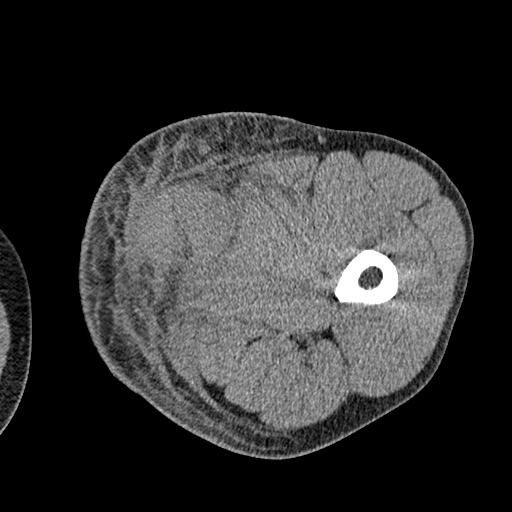
[im 99/184  bone]
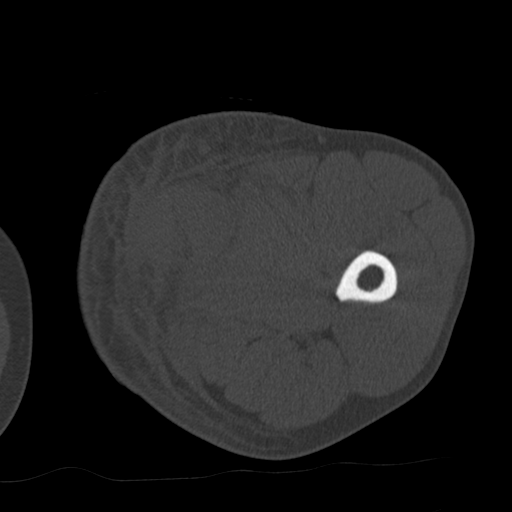
[im 113/184  bone]
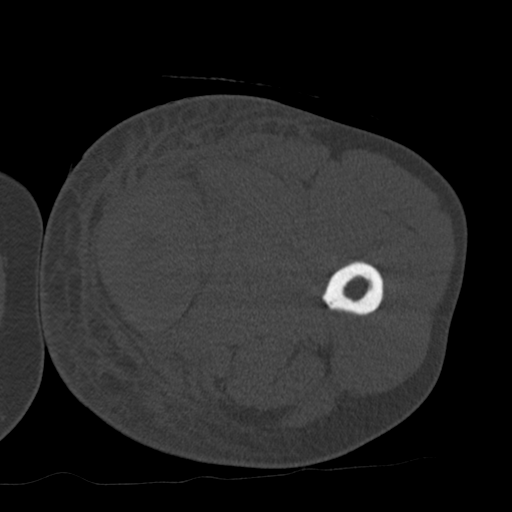
[im 127/184  bone]
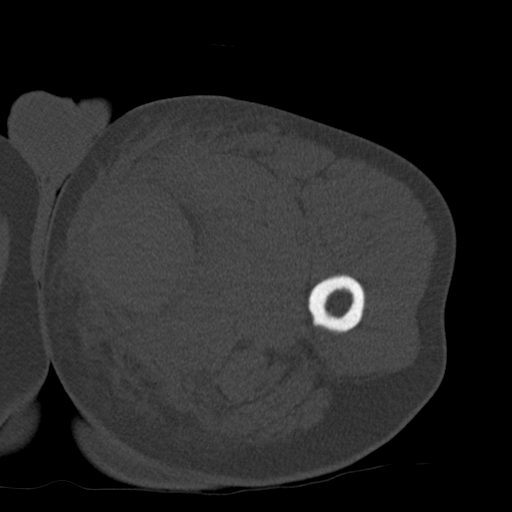
[im 155/184  bone]
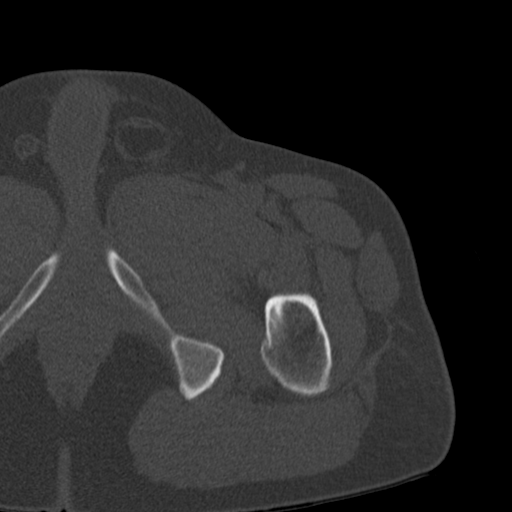
[im 169/184  soft-tissue]
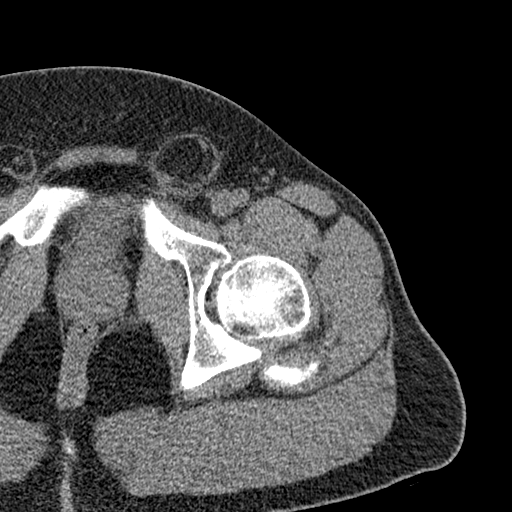
[im 169/184  bone]
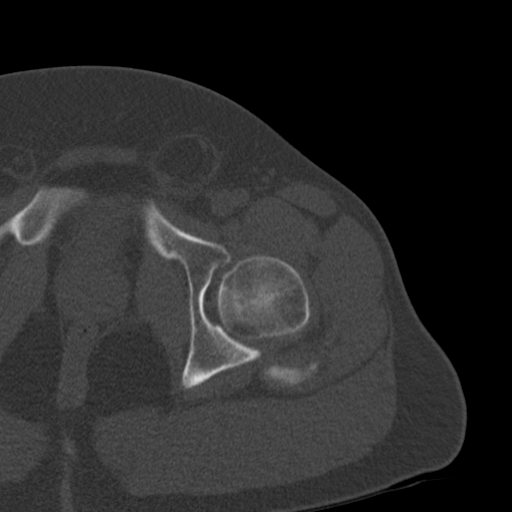

[Series 9: coronalsoft tissue · coronal · 0.49mm/px · 3 of 124 slices shown]
[im 25/124  bone]
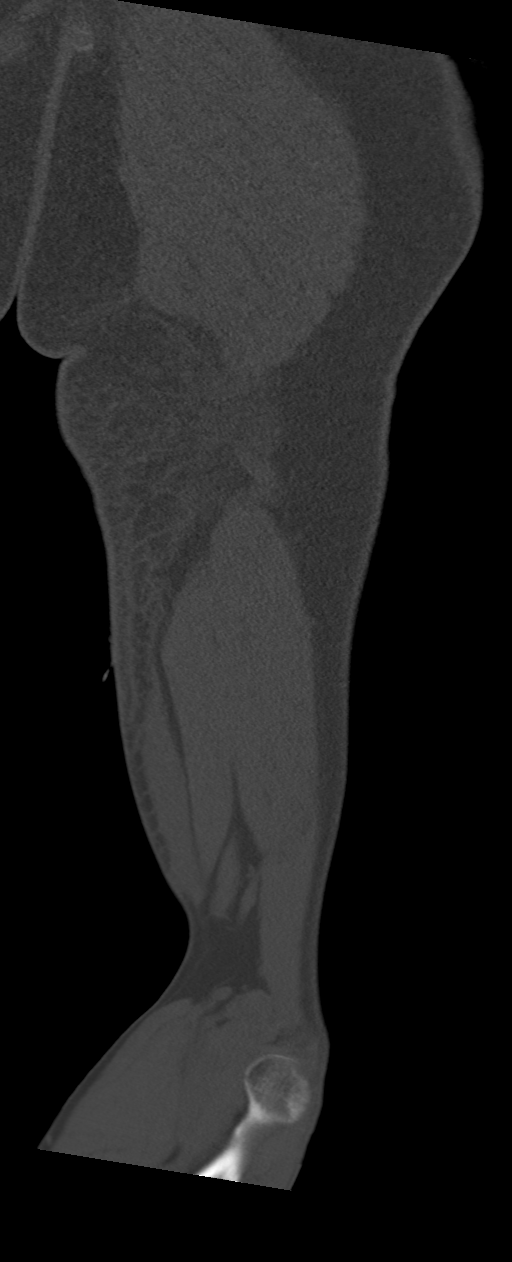
[im 50/124  bone]
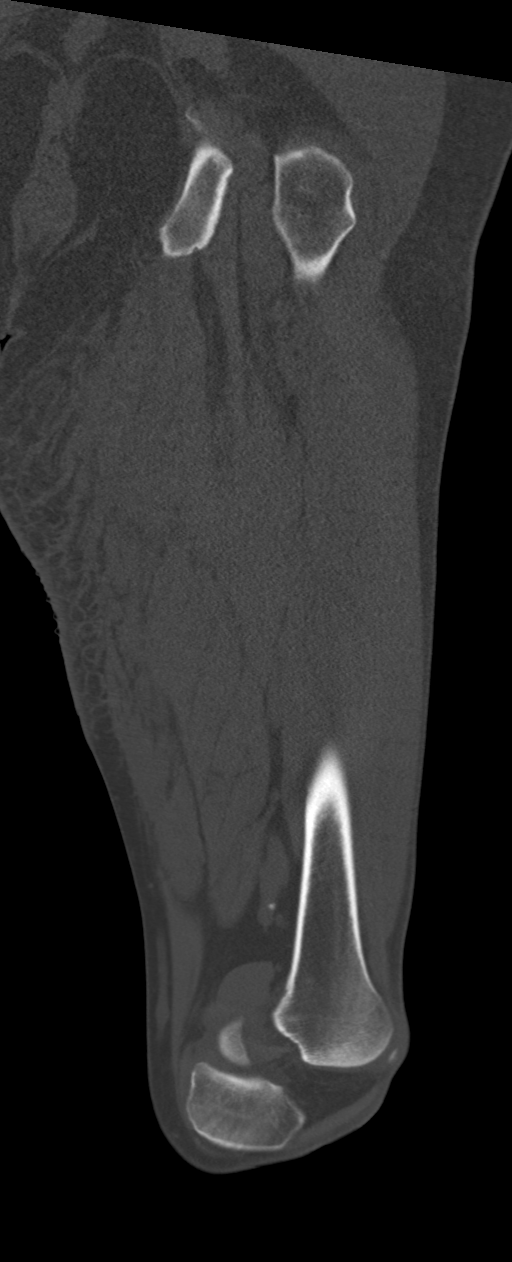
[im 74/124  bone]
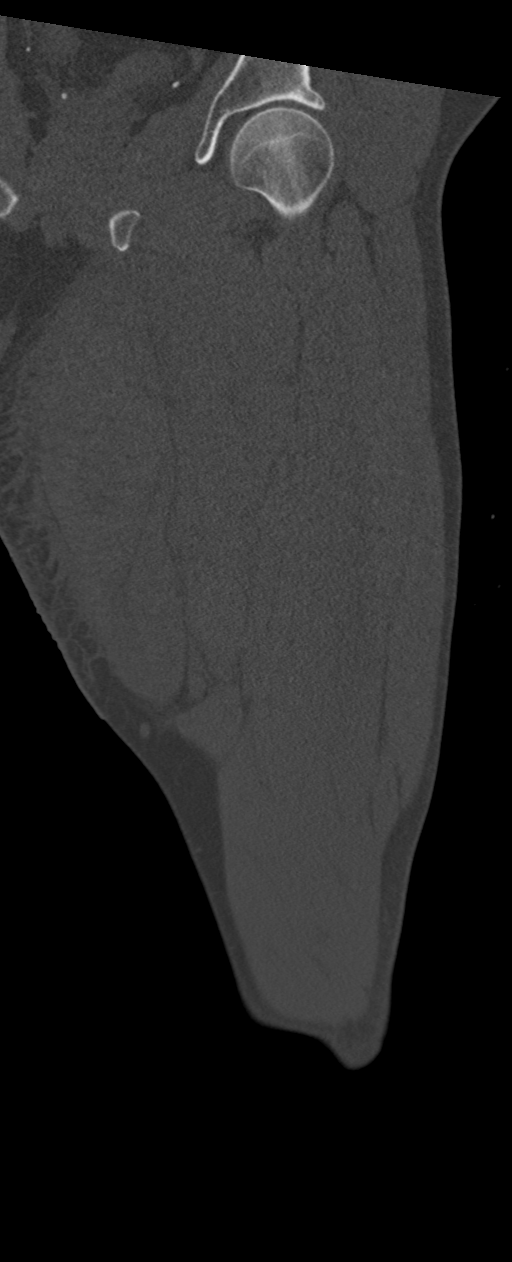

[12 of 34 positions shown; findings below may reference images not displayed]

FINDINGS: Bones/Joint/Cartilage

No fracture or dislocation. Joint spaces are preserved. No joint
effusion.

Ligaments

Ligaments are suboptimally evaluated by CT.

Muscles and Tendons
7.4 x 5.3 x 9.8 cm heterogeneous mass in the adductor compartment of
the medial left thigh with peripheral hyperdensity in central
hypodensity. No muscle atrophy.

Soft tissue
Prominent superficial soft tissue swelling in the medial upper
thigh.
IMPRESSION: 1. 7.4 x 5.3 x 9.8 cm heterogeneous mass in the adductor compartment
of the medial left thigh, possibly hematoma given clinical history.
However, in the absence of acute injury or blood thinners, an
underlying solid neoplasm that bled is not excluded. Recommend MRI
of the left thigh with and without contrast for further evaluation.
Abscess is considered less likely in the absence of signs or
symptoms of infection.
2. No acute osseous abnormality.

## 2021-11-04 ENCOUNTER — Encounter (HOSPITAL_COMMUNITY): Payer: Self-pay

## 2021-11-04 ENCOUNTER — Emergency Department (HOSPITAL_COMMUNITY)
Admission: EM | Admit: 2021-11-04 | Discharge: 2021-11-04 | Disposition: A | Discharge status: Home / Self Care | Payer: Medicare Other | Source: Home / Self Care | Attending: Student | Admitting: Student

## 2021-11-04 ENCOUNTER — Encounter (HOSPITAL_COMMUNITY): Payer: Self-pay | Admitting: Emergency Medicine

## 2021-11-04 ENCOUNTER — Other Ambulatory Visit: Payer: Self-pay

## 2021-11-04 ENCOUNTER — Emergency Department (HOSPITAL_COMMUNITY): Payer: Medicare Other

## 2021-11-04 ENCOUNTER — Emergency Department (HOSPITAL_COMMUNITY)
Admission: EM | Admit: 2021-11-04 | Discharge: 2021-11-04 | Disposition: A | Discharge status: Home / Self Care | Payer: Medicare Other | Attending: Emergency Medicine | Admitting: Emergency Medicine

## 2021-11-04 DIAGNOSIS — T400X1A Poisoning by opium, accidental (unintentional), initial encounter: Secondary | ICD-10-CM | POA: Insufficient documentation

## 2021-11-04 DIAGNOSIS — T40601A Poisoning by unspecified narcotics, accidental (unintentional), initial encounter: Secondary | ICD-10-CM

## 2021-11-04 DIAGNOSIS — M79671 Pain in right foot: Secondary | ICD-10-CM | POA: Insufficient documentation

## 2021-11-04 LAB — BASIC METABOLIC PANEL WITH GFR
Anion gap: 8 (ref 5–15)
BUN: 21 mg/dL — ABNORMAL HIGH (ref 6–20)
CO2: 29 mmol/L (ref 22–32)
Calcium: 8.3 mg/dL — ABNORMAL LOW (ref 8.9–10.3)
Chloride: 103 mmol/L (ref 98–111)
Creatinine, Ser: 1 mg/dL (ref 0.61–1.24)
GFR, Estimated: >60 mL/min
Glucose, Bld: 136 mg/dL — ABNORMAL HIGH (ref 70–99)
Potassium: 3 mmol/L — ABNORMAL LOW (ref 3.5–5.1)
Sodium: 140 mmol/L (ref 135–145)

## 2021-11-04 LAB — CBC WITH DIFFERENTIAL/PLATELET
Abs Immature Granulocytes: 0.03 10*3/uL (ref 0.00–0.07)
Basophils Absolute: 0 10*3/uL (ref 0.0–0.1)
Basophils Relative: 0 %
Eosinophils Absolute: 0.1 10*3/uL (ref 0.0–0.5)
Eosinophils Relative: 1 %
HCT: 44.2 % (ref 39.0–52.0)
Hemoglobin: 14.6 g/dL (ref 13.0–17.0)
Immature Granulocytes: 0 %
Lymphocytes Relative: 13 %
Lymphs Abs: 1 10*3/uL (ref 0.7–4.0)
MCH: 29.7 pg (ref 26.0–34.0)
MCHC: 33 g/dL (ref 30.0–36.0)
MCV: 90 fL (ref 80.0–100.0)
Monocytes Absolute: 0.7 10*3/uL (ref 0.1–1.0)
Monocytes Relative: 9 %
Neutro Abs: 6 10*3/uL (ref 1.7–7.7)
Neutrophils Relative %: 77 %
Platelets: 207 10*3/uL (ref 150–400)
RBC: 4.91 MIL/uL (ref 4.22–5.81)
RDW: 13.5 % (ref 11.5–15.5)
WBC: 7.8 10*3/uL (ref 4.0–10.5)
nRBC: 0 % (ref 0.0–0.2)

## 2021-11-04 LAB — RAPID URINE DRUG SCREEN, HOSP PERFORMED
Amphetamines: POSITIVE — AB
Barbiturates: NOT DETECTED
Benzodiazepines: NOT DETECTED
Cocaine: POSITIVE — AB
Opiates: NOT DETECTED
Tetrahydrocannabinol: POSITIVE — AB

## 2021-11-04 LAB — ETHANOL: Alcohol, Ethyl (B): <10 mg/dL (ref <10)

## 2021-11-04 LAB — TROPONIN I (HIGH SENSITIVITY): Troponin I (High Sensitivity): 8 ng/L

## 2021-11-04 MED ORDER — IBUPROFEN 200 MG PO TABS
600.0000 mg | ORAL_TABLET | Freq: Once | ORAL | Status: AC
Start: 2021-11-04 — End: 2021-11-04
  Administered 2021-11-04: 600 mg via ORAL
  Filled 2021-11-04: qty 3

## 2021-11-04 NOTE — Discharge Instructions (Signed)
You were seen today for foot pain.  This may be due to to arthritis or due to overuse.  For acute pain you may use ice as needed, you may also use Advil for inflammation.  You may want to soak your feet at the end of the day.  Please follow-up as needed with your primary care provider.

## 2021-11-04 NOTE — ED Notes (Signed)
Pt ambulatory to restroom. Steady gait noted.

## 2021-11-04 NOTE — ED Provider Notes (Signed)
[facility]-EMERGENCY DEPT Provider Note   CSN: [id number] Arrival date & time: 11/04/21  8338     History  Chief Complaint  Patient presents with   Foot Pain    Carl Skinner is a 51 y.o. male.  ASL interpreter used throughout encounter.  Patient presents to the hospital complaining of right foot pain.  He presented to the triage area complaining of pain in the right foot after walking all day and jumping, landing on the foot.  He is unable to localize the pain, just complains of generalized right foot pain.  Of note the patient was seen at the same facility at approximately 2 AM due to an apparent opioid overdose which required Narcan and CPR.  He was discharged at 6 AM this morning.  Patient with past medical history significant for anxiety, depression.  Patient is deaf  HPI     Home Medications Prior to Admission medications   Medication Sig Start Date End Date Taking? Authorizing Provider  atomoxetine (STRATTERA) 40 MG capsule Take 40 mg by mouth daily. Patient not taking: Reported on 11/04/2021 10/14/21   [provider]  cephALEXin (KEFLEX) 500 MG capsule Take 500 mg by mouth 4 (four) times daily. Patient not taking: Reported on 11/04/2021 10/14/21   [provider]  cyanocobalamin 1000 MCG tablet Take 1,000 mcg by mouth daily. Patient not taking: Reported on 11/04/2021 10/14/21   [provider]  DULoxetine (CYMBALTA) 30 MG capsule Take 30 mg by mouth daily. Patient not taking: Reported on 11/04/2021 09/29/21   [provider]  gabapentin (NEURONTIN) 600 MG tablet Take 1 tablet (600 mg total) by mouth 3 (three) times daily. Patient not taking: Reported on 11/04/2021 09/23/20 10/23/20  Claiborne Rigg, NP  lidocaine (LIDODERM) 5 % Place 1 patch onto the skin daily. Remove & Discard patch within 12 hours or as directed by MD Patient not taking: Reported on 11/04/2021 09/01/20   Palumbo, April, MD  LORazepam (ATIVAN) 1 MG tablet Take  1 mg by mouth 2 (two) times daily. Patient not taking: Reported on 11/04/2021 10/14/21   [provider]  sertraline (ZOLOFT) 50 MG tablet Take 1 tablet (50 mg total) by mouth daily. Patient not taking: Reported on 11/04/2021 10/13/20 10/13/21  Meta Hatchet, PA  SM NICOTINE 21 MG/24HR patch Place 21 mg onto the skin daily. Patient not taking: Reported on 11/04/2021 09/29/21   [provider]  sulfamethoxazole-trimethoprim (BACTRIM DS) 800-160 MG tablet Take 1 tablet by mouth 2 (two) times daily. For 10 days Patient not taking: Reported on 11/04/2021    [provider]      Allergies    Shrimp [shellfish allergy] and Shrimp extract allergy skin test    Review of Systems   Review of Systems  Musculoskeletal:  Positive for arthralgias. Negative for joint swelling.    Physical Exam Updated Vital Signs BP 98/64 (BP Location: Left Arm)   Pulse 78   Temp 97.6 F (36.4 C) (Oral)   Resp 18   SpO2 98%  Physical Exam Vitals and nursing note reviewed.  Constitutional:      General: He is not in acute distress. HENT:     Head: Normocephalic.  Eyes:     Conjunctiva/sclera: Conjunctivae normal.  Cardiovascular:     Rate and Rhythm: Normal rate.  Pulmonary:     Effort: Pulmonary effort is normal.  Musculoskeletal:        General: No swelling, tenderness, deformity or signs of injury.  Normal range of motion.     Cervical back: Normal range of motion and neck supple.  Skin:    General: Skin is warm and dry.  Neurological:     Mental Status: He is alert.  Psychiatric:        Behavior: Behavior normal.     ED Results / Procedures / Treatments   Labs (all labs ordered are listed, but only abnormal results are displayed) Labs Reviewed - No data to display  EKG None  Radiology DG Foot Complete Right  Result Date: 11/04/2021 CLINICAL DATA:  Left dorsal foot pain wound near fourth tarsometatarsal joint. EXAM: RIGHT FOOT COMPLETE - 3+ VIEW COMPARISON:  None  available FINDINGS: Minimal great toe metatarsophalangeal joint space narrowing and marginal osteophytosis. The tarsometatarsal joint spaces appear maintained. The cortices are intact. No subcutaneous air. No significant soft tissue wound/ulceration is visualized on radiographs. No acute fracture or dislocation. IMPRESSION: 1. Minimal great toe metatarsophalangeal osteoarthritis. 2. Intact cortices without radiographic evidence of acute osteomyelitis. Electronically Signed   By: Neita Garnet M.D.   On: 11/04/2021 08:15    Procedures Procedures    Medications Ordered in ED Medications  ibuprofen (ADVIL) tablet 600 mg (has no administration in time range)    ED Course/ Medical Decision Making/ A&P                           Medical Decision Making Amount and/or Complexity of Data Reviewed Radiology: ordered.   Patient presents to the hospital complaining of generalized right foot pain.  Differential includes fracture, dislocation, soft tissue injury, arthritis, and others  I ordered and personally interpreted imaging including plain films of the right foot.  Minimal great toe metatarsophalangeal osteoarthritis.  Intact cortices without radiographic evidence of acute osteomyelitis.  I agree with the radiologist findings  I ordered the patient Advil for his pain.  Upon reassessment the patient seems to be feeling comfortable.  I see no indication for further work-up at this time.  This pain may be due to arthritis or may be due to overuse.  No fracture or dislocation was noted on imaging.  The patient's history does not suggest a soft tissue injury or sprain.  The patient seems comfortable at this time, requesting soft drinks before discharge.  He is ready for discharge home.  He may take Advil as needed for pain.        Final Clinical Impression(s) / ED Diagnoses Final diagnoses:  Foot pain, right    Rx / DC Orders ED Discharge Orders     None         Pamala Duffel 11/04/21 0830    Kommor, Wyn Forster, MD 11/05/21 1041

## 2021-11-04 NOTE — Discharge Instructions (Signed)
Follow-up with your primary doctor if you experience any additional issues. 

## 2021-11-04 NOTE — ED Provider Notes (Signed)
[facility]-EM[facility]ENCY DEPT Provider Note   CSN: [id number] Arrival date & time: 11/04/21  0147     History  Chief Complaint  Patient presents with   Drug Overdose    Lex Linhares is a 51 y.o. male.  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  Patient presenting today after an apparent drug overdose.  From what I am told, patient was injecting, then became unresponsive.  I am told he performed CPR for 20 minutes prior to EMS arrival.  He was then given Narcan with good response.  He experienced vomiting afterward.  Patient currently resting comfortably and without complaint.  The history is provided by the patient.       Home Medications Prior to Admission medications   Medication Sig Start Date End Date Taking? Authorizing Provider  gabapentin (NEURONTIN) 600 MG tablet Take 1 tablet (600 mg total) by mouth 3 (three) times daily. 09/23/20 10/23/20  Claiborne Rigg, NP  lidocaine (LIDODERM) 5 % Place 1 patch onto the skin daily. Remove & Discard patch within 12 hours or as directed by MD 09/01/20   Nicanor Alcon, April, MD  QUEtiapine (SEROQUEL) 50 MG tablet TAKE 0.5-1 TABLETS (25-50 MG TOTAL) BY MOUTH AT BEDTIME. START WITH TAKING 1/2 TABLET DAILY FOR 3 WEEKS. AFTER 3 WEEKS IF NO IMPROVEMENT IN SYMPTOMS MAY INCREASE TO 50 MG OR ONE WHOLE TABLET DAILY BY MOUTH. 10/16/20 01/14/21  Hoy Register, MD  sertraline (ZOLOFT) 50 MG tablet Take 1 tablet (50 mg total) by mouth daily. 10/13/20 10/13/21  Meta Hatchet, PA      Allergies    Shrimp [shellfish allergy] and Shrimp extract allergy skin test    Review of Systems   Review of Systems  All other systems reviewed and are negative.   Physical Exam Updated Vital Signs BP (!) 120/96   Pulse (!) 49   Temp 98 F (36.7 C)   Resp 14   Ht 5\' 10"  (1.778 m)   Wt 98 kg   SpO2 100%   BMI 31.00 kg/m   Physical Exam Vitals and nursing note reviewed.  Constitutional:      General: He is not in acute distress.    Appearance: He is well-developed. He is not diaphoretic.     Comments: Patient is awake and alert, however communication difficult secondary to patient communicating via sign language.  HENT:     Head: Normocephalic and atraumatic.  Cardiovascular:     Rate and Rhythm: Normal rate and regular rhythm.     Heart sounds: No murmur heard.    No friction rub.  Pulmonary:     Effort: Pulmonary effort is normal. No respiratory distress.     Breath sounds: Normal breath sounds. No wheezing or rales.  Abdominal:     General: Bowel sounds are normal. There is no distension.     Palpations: Abdomen is soft.     Tenderness: There is no abdominal tenderness.  Musculoskeletal:        General: Normal range of motion.     Cervical back: Normal range of motion and neck supple.  Skin:    General: Skin is warm and dry.  Neurological:     Mental Status: He is alert and oriented to person, place, and time.     Coordination: Coordination normal.     ED Results / Procedures / Treatments   Labs (all labs ordered are  listed, but only abnormal results are displayed) Labs Reviewed - No data to display  EKG None  Radiology No results found.  Procedures Procedures    Medications Ordered in ED Medications - No data to display  ED Course/ Medical Decision Making/ A&P  Patient brought here after an opioid overdose during which time he received Narcan and CPR.  Patient arrives here awake and alert.  He is now ambulatory in the department and showing no untoward effects.  EKG is unchanged, laboratory studies are unremarkable, and patient back to baseline.  He will be discharged with as needed return.  Final Clinical Impression(s) / ED Diagnoses Final diagnoses:  None    Rx / DC Orders ED Discharge Orders     None         Geoffery Lyons, MD 11/04/21 [id number]

## 2021-11-04 NOTE — ED Triage Notes (Signed)
BIBA for overdose on unknown substance. Bystanders was performing CPR for per ems. Pulse present with respiration of 8 on EMS arrival. EMS admin Narcan with response. Per ems patient had vomited and family was trying to spoon it out of mouth with the needle patient was using to inject with.   A&O x4 asking for water on arrival. Pt uses Sign language.

## 2021-11-04 NOTE — ED Triage Notes (Signed)
Patient here from bench outside requesting x-ray of right foot states that "it hurts". Sign language interpreter.

## 2021-11-10 ENCOUNTER — Other Ambulatory Visit: Payer: Self-pay

## 2021-11-10 ENCOUNTER — Encounter (HOSPITAL_COMMUNITY): Payer: Self-pay | Admitting: *Deleted

## 2021-11-10 ENCOUNTER — Ambulatory Visit (HOSPITAL_COMMUNITY)
Admission: EM | Admit: 2021-11-10 | Discharge: 2021-11-10 | Disposition: A | Discharge status: Home / Self Care | Payer: Medicare Other

## 2021-11-10 ENCOUNTER — Emergency Department (HOSPITAL_COMMUNITY): Payer: Medicare Other

## 2021-11-10 ENCOUNTER — Emergency Department (HOSPITAL_COMMUNITY)
Admission: EM | Admit: 2021-11-10 | Discharge: 2021-11-10 | Disposition: A | Discharge status: Home / Self Care | Payer: Medicare Other | Attending: Emergency Medicine | Admitting: Emergency Medicine

## 2021-11-10 DIAGNOSIS — Z5900 Homelessness unspecified: Secondary | ICD-10-CM | POA: Diagnosis not present

## 2021-11-10 DIAGNOSIS — M25552 Pain in left hip: Secondary | ICD-10-CM | POA: Diagnosis present

## 2021-11-10 DIAGNOSIS — F1910 Other psychoactive substance abuse, uncomplicated: Secondary | ICD-10-CM | POA: Diagnosis not present

## 2021-11-10 HISTORY — DX: Other psychoactive substance abuse, uncomplicated: F19.10

## 2021-11-10 MED ORDER — IBUPROFEN 600 MG PO TABS: 600.0000 mg | ORAL_TABLET | Freq: Four times a day (QID) | ORAL | 0 refills | Status: AC | PRN

## 2021-11-10 MED ORDER — ONDANSETRON 4 MG PO TBDP
4.0000 mg | ORAL_TABLET | Freq: Once | ORAL | Status: AC
  Administered 2021-11-10: 4 mg via ORAL
  Filled 2021-11-10: qty 1

## 2021-11-10 MED ORDER — LORAZEPAM 1 MG PO TABS
1.0000 mg | ORAL_TABLET | Freq: Once | ORAL | Status: AC
  Administered 2021-11-10: 1 mg via ORAL
  Filled 2021-11-10: qty 1

## 2021-11-10 MED ORDER — KETOROLAC TROMETHAMINE 30 MG/ML IJ SOLN
30.0000 mg | Freq: Once | INTRAMUSCULAR | Status: AC
Start: 2021-11-10 — End: 2021-11-10
  Administered 2021-11-10: 30 mg via INTRAMUSCULAR

## 2021-11-10 MED ORDER — KETOROLAC TROMETHAMINE 30 MG/ML IJ SOLN
INTRAMUSCULAR | Status: AC
  Filled 2021-11-10: qty 1

## 2021-11-10 MED ORDER — IBUPROFEN 800 MG PO TABS
800.0000 mg | ORAL_TABLET | Freq: Once | ORAL | Status: AC
  Administered 2021-11-10: 800 mg via ORAL
  Filled 2021-11-10: qty 1

## 2021-11-10 NOTE — ED Triage Notes (Signed)
[place] [facility] [place] Per report, he C/o 10/10 hip and bilateral feet pain for 1 week. Has been sleeping outside and not eaten in 2 days. Has blisters on the sides of his feet. States vomiting for the past week.En route 110/82, 72hr, rr 18, 98% ra, cbg 125

## 2021-11-10 NOTE — ED Notes (Signed)
Patient verbalizes understanding of discharge instructions. Opportunity for questioning and answers were provided. Armband removed by staff, pt discharged from ED and ambulated to lobby to go home via bus. Interpreter used for d/c instructions.

## 2021-11-10 NOTE — ED Provider Notes (Signed)
[facility]    CSN: [id number] Arrival date & time: 11/10/21  1411      History   Chief Complaint Chief Complaint  Patient presents with   Hip Pain    (ASL interpreter needed)    HPI Carl Skinner is a 51 y.o. male.   Patient presents with left hip pain with beginning 6 days ago.  Pain is constant and is worse with bearing weight.  Range of motion is intact.  Endorses that he is having increase in walking recently.  Endorses that he was seen a 6 days ago by no one was there and available to help with treatment.  Currently requesting to reduce his pain.  Admits to meth use within the last few hours.   Sign language interpreter used  Past Medical History:  Diagnosis Date   Anxiety    Deaf    Depression    Substance abuse [facility])     Patient Active Problem List   Diagnosis Date Noted   Moderate episode of recurrent major depressive disorder (HCC) 07/10/2020   Generalized anxiety disorder 07/10/2020   Agitation 07/10/2020    Past Surgical History:  Procedure Laterality Date   BICEPT TENODESIS Left 06/20/2019   Procedure: BICEPS TENODESIS;  Surgeon: Bjorn Pippin, MD;  Location: [facility];  Service: Orthopedics;  Laterality: Left;   NOSE SURGERY     ROTATOR CUFF REPAIR Left        Home Medications    Prior to Admission medications   Medication Sig Start Date End Date Taking? Authorizing Provider  cyanocobalamin 1000 MCG tablet Take 1,000 mcg by mouth daily. 10/14/21  Yes [provider]  atomoxetine (STRATTERA) 40 MG capsule Take 40 mg by mouth daily. 10/14/21   [provider]  cephALEXin (KEFLEX) 500 MG capsule Take 500 mg by mouth 4 (four) times daily. Patient not taking: Reported on 11/04/2021 10/14/21   [provider]  DULoxetine (CYMBALTA) 30 MG capsule Take 30 mg by mouth daily. Patient not taking: Reported on 11/04/2021 09/29/21   [provider]  gabapentin (NEURONTIN) 600 MG tablet Take 1 tablet  (600 mg total) by mouth 3 (three) times daily. 09/23/20 11/10/21  Claiborne Rigg, NP  ibuprofen (ADVIL) 600 MG tablet Take 1 tablet (600 mg total) by mouth every 6 (six) hours as needed. 11/10/21   Edwin Dada P, DO  lidocaine (LIDODERM) 5 % Place 1 patch onto the skin daily. Remove & Discard patch within 12 hours or as directed by MD Patient not taking: Reported on 11/04/2021 09/01/20   Palumbo, April, MD  LORazepam (ATIVAN) 1 MG tablet Take 1 mg by mouth 2 (two) times daily. 10/14/21   [provider]  sertraline (ZOLOFT) 50 MG tablet Take 1 tablet (50 mg total) by mouth daily. 10/13/20 11/10/21  Nwoko, Tommas Olp, PA  SM NICOTINE 21 MG/24HR patch Place 21 mg onto the skin daily. Patient not taking: Reported on 11/04/2021 09/29/21   [provider]  sulfamethoxazole-trimethoprim (BACTRIM DS) 800-160 MG tablet Take 1 tablet by mouth 2 (two) times daily. For 10 days Patient not taking: Reported on 11/04/2021    [provider]  UNKNOWN TO PATIENT "ADHD med and depression med"    [provider]    Family History Family History  Problem Relation Age of Onset   Healthy Mother    Drug abuse Mother    Healthy Father    Drug abuse Sister    Drug abuse Maternal  Uncle     Social History Social History   Tobacco Use   Smoking status: Every Day    Packs/day: 0.50    Types: Cigarettes   Smokeless tobacco: Never  Substance Use Topics   Alcohol use: Not Currently   Drug use: Yes    Types: Methamphetamines    Comment: "currently high on meth" - states used this AM     Allergies   Shrimp [shellfish allergy] and Shrimp extract allergy skin test   Review of Systems Review of Systems  Constitutional: Negative.   Respiratory: Negative.    Musculoskeletal:  Positive for myalgias. Negative for arthralgias, back pain, gait problem, joint swelling, neck pain and neck stiffness.  Skin: Negative.   Neurological: Negative.      Physical Exam Triage Vital  Signs ED Triage Vitals  Enc Vitals Group     BP 11/10/21 1431 113/69     Pulse Rate 11/10/21 1431 (!) 50     Resp 11/10/21 1431 16     Temp 11/10/21 1431 97.7 F (36.5 C)     Temp Source 11/10/21 1431 Oral     SpO2 11/10/21 1431 98 %     Weight --      Height --      Head Circumference --      Peak Flow --      Pain Score 11/10/21 1430 10     Pain Loc --      Pain Edu? --      Excl. in GC? --    No data found.  Updated Vital Signs BP 113/69   Pulse (!) 50   Temp 97.7 F (36.5 C) (Oral)   Resp 16   SpO2 98%   Visual Acuity Right Eye Distance:   Left Eye Distance:   Bilateral Distance:    Right Eye Near:   Left Eye Near:    Bilateral Near:     Physical Exam Constitutional:      Comments: Falling asleep during exam but not lethargic, able to awaken with touch  Eyes:     Extraocular Movements: Extraocular movements intact.  Musculoskeletal:     Comments: Tenderness is present over the lateral aspect of hip and the left groin, no point tenderness noted, no ecchymosis, swelling or deformity noted, able to bear weight to the left lower extremity, range of motion intact, pain is elicited with extension of the leg, strength is a 4 out of 5, 2+ femoral pulse  Neurological:     Mental Status: He is alert and oriented to person, place, and time. Mental status is at baseline.  Psychiatric:        Mood and Affect: Mood normal.        Behavior: Behavior normal.      UC Treatments / Results  Labs (all labs ordered are listed, but only abnormal results are displayed) Labs Reviewed - No data to display  EKG   Radiology DG Hip Unilat W or Wo Pelvis 2-3 Views Left  Result Date: 11/10/2021 CLINICAL DATA:  LEFT hip pain after walking long distance EXAM: DG HIP (WITH OR WITHOUT PELVIS) 2-3V LEFT COMPARISON:  None Available. FINDINGS: Hips are located. No evidence of pelvic fracture or sacral fracture. Dedicated view of the LEFT hip demonstrates no femoral neck fracture.  IMPRESSION: No acute osseous abnormality. Electronically Signed   By: Genevive Bi M.D.   On: 11/10/2021 08:48    Procedures Procedures (including critical care time)  Medications Ordered in UC  Medications  ketorolac (TORADOL) 30 MG/ML injection 30 mg (30 mg Intramuscular Given 11/10/21 1456)    Initial Impression / Assessment and Plan / UC Course  I have reviewed the triage vital signs and the nursing notes.  Pertinent labs & imaging results that were available during my care of the patient were reviewed by me and considered in my medical decision making (see chart for details).  Left hip pain  X-ray completed in emergency department this morning, negative, reviewed findings with patient, Toradol injection given in office and recommended over-the-counter analgesics for management of discomfort as patient endorses he does not have money to pick up a prescription, recommended RICE as needed for supportive measures, patient requesting information for rehabilitation for meth use, per chart review patient was seen by social work who reviewed resources with patient, given information for the behavioral health clinic as needed for follow-up Final Clinical Impressions(s) / UC Diagnoses   Final diagnoses:  Left hip pain     Discharge Instructions      Your pain is most likely caused by irritation to the muscles or ligaments.   Your x-ray completed in the emergency department today was negative for injury to your bones  You may use heating pad in 15 minute intervals as needed for additional comfort or you may find comfort in using ice in 10-15 minutes over affected area  Begin stretching affected area daily for 10 minutes as tolerated to further loosen muscles   When lying down try to cushion area with a pillow or lay on opposite sides       ED Prescriptions   None    PDMP not reviewed this encounter.   Hans Eden, NP 11/10/21 1513

## 2021-11-10 NOTE — ED Triage Notes (Signed)
Via sign launguage interpreter, the pt says he has been walking for a week and has not eaten for two days, says his feet and left hip hurt. Denies falling. Headache. Wants to go to meth rehab. Last use this morning. ETOH, drinks everyday, last yesterday.  Says he has meds but does not take them.

## 2021-11-10 NOTE — ED Notes (Signed)
Pt transported to xray 

## 2021-11-10 NOTE — ED Provider Notes (Signed)
[facility] EMERGENCY DEPARTMENT Provider Note   CSN: [id number] Arrival date & time: 11/10/21  0159     History  Chief Complaint  Patient presents with   Hip Pain    Carl Skinner is a 51 y.o. male.  Patient is a 51 year old male with past medical history of homelessness, meth use, and alcohol use presenting for complaints of hip pain and request for rehab.  Patient admits to left-sided hip pain after walking long distances and on his feet all day.  Pain is worse after the end of the day.  Denies any wounds, falls, or bruising.  Denies sensation or motor dysfunction in the leg.  Patient also admits to last alcohol and meth use yesterday morning.  Requesting help with getting into meth rehab.  Also requesting food and water.  The history is provided by the patient. A language interpreter was used (sign language interpreter).  Foot Pain Pertinent negatives include no chest pain, no abdominal pain and no shortness of breath.       Home Medications Prior to Admission medications   Medication Sig Start Date End Date Taking? Authorizing Provider  ibuprofen (ADVIL) 600 MG tablet Take 1 tablet (600 mg total) by mouth every 6 (six) hours as needed. 11/10/21  Yes Edwin Dada P, DO  atomoxetine (STRATTERA) 40 MG capsule Take 40 mg by mouth daily. Patient not taking: Reported on 11/04/2021 10/14/21   [provider]  cephALEXin (KEFLEX) 500 MG capsule Take 500 mg by mouth 4 (four) times daily. Patient not taking: Reported on 11/04/2021 10/14/21   [provider]  cyanocobalamin 1000 MCG tablet Take 1,000 mcg by mouth daily. Patient not taking: Reported on 11/04/2021 10/14/21   [provider]  DULoxetine (CYMBALTA) 30 MG capsule Take 30 mg by mouth daily. Patient not taking: Reported on 11/04/2021 09/29/21   [provider]  gabapentin (NEURONTIN) 600 MG tablet Take 1 tablet (600 mg total) by mouth 3 (three) times daily. Patient not taking:  Reported on 11/04/2021 09/23/20 10/23/20  Claiborne Rigg, NP  lidocaine (LIDODERM) 5 % Place 1 patch onto the skin daily. Remove & Discard patch within 12 hours or as directed by MD Patient not taking: Reported on 11/04/2021 09/01/20   Palumbo, April, MD  LORazepam (ATIVAN) 1 MG tablet Take 1 mg by mouth 2 (two) times daily. Patient not taking: Reported on 11/04/2021 10/14/21   [provider]  sertraline (ZOLOFT) 50 MG tablet Take 1 tablet (50 mg total) by mouth daily. Patient not taking: Reported on 11/04/2021 10/13/20 10/13/21  Meta Hatchet, PA  SM NICOTINE 21 MG/24HR patch Place 21 mg onto the skin daily. Patient not taking: Reported on 11/04/2021 09/29/21   [provider]  sulfamethoxazole-trimethoprim (BACTRIM DS) 800-160 MG tablet Take 1 tablet by mouth 2 (two) times daily. For 10 days Patient not taking: Reported on 11/04/2021    [provider]      Allergies    Shrimp [shellfish allergy] and Shrimp extract allergy skin test    Review of Systems   Review of Systems  Constitutional:  Negative for chills and fever.  HENT:  Negative for ear pain and sore throat.   Eyes:  Negative for pain and visual disturbance.  Respiratory:  Negative for cough and shortness of breath.   Cardiovascular:  Negative for chest pain and palpitations.  Gastrointestinal:  Negative for abdominal pain and vomiting.  Genitourinary:  Negative for dysuria and hematuria.  Musculoskeletal:  Positive for  gait problem. Negative for arthralgias and back pain.  Skin:  Negative for color change and rash.  Neurological:  Negative for seizures and syncope.  All other systems reviewed and are negative.   Physical Exam Updated Vital Signs BP 110/66   Pulse (!) 55   Temp 97.8 F (36.6 C) (Oral)   Resp 18   SpO2 100%  Physical Exam Vitals and nursing note reviewed.  Constitutional:      General: He is not in acute distress.    Appearance: He is well-developed.  HENT:     Head:  Normocephalic and atraumatic.  Eyes:     Conjunctiva/sclera: Conjunctivae normal.  Cardiovascular:     Rate and Rhythm: Normal rate and regular rhythm.     Pulses:          Dorsalis pedis pulses are 2+ on the right side and 2+ on the left side.     Heart sounds: No murmur heard. Pulmonary:     Effort: Pulmonary effort is normal. No respiratory distress.     Breath sounds: Normal breath sounds.  Abdominal:     Palpations: Abdomen is soft.     Tenderness: There is no abdominal tenderness.  Musculoskeletal:        General: No swelling.     Cervical back: Neck supple.     Right hip: Normal.     Left hip: Bony tenderness present. No deformity, lacerations or crepitus. Normal range of motion. Normal strength.     Right upper leg: Normal.     Left upper leg: Normal.     Right knee: Normal.     Left knee: Normal.     Right lower leg: Normal.     Left lower leg: Normal.     Right ankle: Normal.     Left ankle: Normal.  Skin:    General: Skin is warm and dry.     Capillary Refill: Capillary refill takes less than 2 seconds.  Neurological:     Mental Status: He is alert and oriented to person, place, and time.     GCS: GCS eye subscore is 4. GCS verbal subscore is 5. GCS motor subscore is 6.     Sensory: Sensation is intact.     Motor: Motor function is intact.  Psychiatric:        Mood and Affect: Mood normal.     ED Results / Procedures / Treatments   Labs (all labs ordered are listed, but only abnormal results are displayed) Labs Reviewed - No data to display  EKG None  Radiology DG Hip Unilat W or Wo Pelvis 2-3 Views Left  Result Date: 11/10/2021 CLINICAL DATA:  LEFT hip pain after walking long distance EXAM: DG HIP (WITH OR WITHOUT PELVIS) 2-3V LEFT COMPARISON:  None Available. FINDINGS: Hips are located. No evidence of pelvic fracture or sacral fracture. Dedicated view of the LEFT hip demonstrates no femoral neck fracture. IMPRESSION: No acute osseous abnormality.  Electronically Signed   By: Genevive Bi M.D.   On: 11/10/2021 08:48    Procedures Procedures    Medications Ordered in ED Medications  ibuprofen (ADVIL) tablet 800 mg (800 mg Oral Given 11/10/21 0827)  LORazepam (ATIVAN) tablet 1 mg (1 mg Oral Given 11/10/21 0827)  ondansetron (ZOFRAN-ODT) disintegrating tablet 4 mg (4 mg Oral Given 11/10/21 0827)    ED Course/ Medical Decision Making/ A&P  Medical Decision Making Amount and/or Complexity of Data Reviewed Radiology: ordered.  Risk Prescription drug management.   6:73 AM 51 year old male with past medical history of homelessness, meth use, and alcohol use presenting for complaints of hip pain and request for rehab.  Patient is alert and oriented x3, no acute distress, afebrile, stable vital signs.  Left hip demonstrates tenderness on palpation.  Otherwise no erythema, swelling, warmth, crepitus, or gross deformities.  X-ray demonstrates osteoarthritis.  Patient given Motrin 800 mg.  Also presenting for help with alcohol and meth detox.  Requesting rehab.  Admits to minimal drawl symptoms at this time described as nausea.  Zofran given.  Last use yesterday morning.  Consult to [facility] to help with homelessness and rehab options.  Patient also requesting food and water.  Food and water given.  X-ray demonstrates no fractures.  Patient is otherwise neurovascularly intact in the leg.  Motrin was given for pain.  Patient recommended for rest and elevation of the leg.  Patient given follow-up with orthopedics if symptoms do not improve in the next 2 weeks.  Patient seen by our transition of care team who was able to set patient up with resources.  Patient in no distress and overall condition improved here in the ED. Detailed discussions were had with the patient regarding current findings, and need for close f/u with PCP or on call doctor. The patient has been instructed to return immediately if the symptoms worsen  in any way for re-evaluation. Patient verbalized understanding and is in agreement with current care plan. All questions answered prior to discharge.         Final Clinical Impression(s) / ED Diagnoses Final diagnoses:  Left hip pain  Substance abuse (HCC)  Homelessness    Rx / DC Orders ED Discharge Orders          Ordered    ibuprofen (ADVIL) 600 MG tablet  Every 6 hours PRN        11/10/21 1039              Franne Forts, DO 11/10/21 1040

## 2021-11-10 NOTE — Discharge Instructions (Addendum)
Your pain is most likely caused by irritation to the muscles or ligaments.   Your x-ray completed in the emergency department today was negative for injury to your bones  You may use heating pad in 15 minute intervals as needed for additional comfort or you may find comfort in using ice in 10-15 minutes over affected area  Begin stretching affected area daily for 10 minutes as tolerated to further loosen muscles   When lying down try to cushion area with a pillow or lay on opposite sides

## 2021-11-10 NOTE — ED Triage Notes (Addendum)
Via ASL video interpreter: C/O new constant left hip pain onset 1 wk ago without any known injury. States is supposed to have "meth rehab treatment"; states he went to the hospital 6 days ago for treatment, but no one was there. He is requesting med to stop pain in hip. Pt admits to being high on meth @ present - states used this AM. Pt with disheveled appearance and poor hygiene.

## 2021-11-10 NOTE — Progress Notes (Signed)
Shelter resources and substance abuse resources attached to patients AVS.

## 2021-11-10 NOTE — ED Notes (Signed)
Patient refused vitals.

## 2021-11-11 ENCOUNTER — Other Ambulatory Visit (HOSPITAL_COMMUNITY)
Admission: EM | Admit: 2021-11-11 | Discharge: 2021-11-15 | Disposition: A | Discharge status: Home / Self Care | Payer: Medicare Other | Attending: Psychiatry | Admitting: Psychiatry

## 2021-11-11 DIAGNOSIS — Z20822 Contact with and (suspected) exposure to covid-19: Secondary | ICD-10-CM | POA: Diagnosis not present

## 2021-11-11 DIAGNOSIS — Z5900 Homelessness unspecified: Secondary | ICD-10-CM | POA: Diagnosis not present

## 2021-11-11 DIAGNOSIS — Z87820 Personal history of traumatic brain injury: Secondary | ICD-10-CM | POA: Insufficient documentation

## 2021-11-11 DIAGNOSIS — H9190 Unspecified hearing loss, unspecified ear: Secondary | ICD-10-CM | POA: Diagnosis not present

## 2021-11-11 DIAGNOSIS — F1520 Other stimulant dependence, uncomplicated: Secondary | ICD-10-CM | POA: Diagnosis present

## 2021-11-11 DIAGNOSIS — F1994 Other psychoactive substance use, unspecified with psychoactive substance-induced mood disorder: Secondary | ICD-10-CM

## 2021-11-11 DIAGNOSIS — F32A Depression, unspecified: Secondary | ICD-10-CM | POA: Diagnosis not present

## 2021-11-11 LAB — RESP PANEL BY RT-PCR (FLU A&B, COVID) ARPGX2
Influenza A by PCR: NEGATIVE
Influenza B by PCR: NEGATIVE
SARS Coronavirus 2 by RT PCR: NEGATIVE

## 2021-11-11 LAB — POC SARS CORONAVIRUS 2 AG: SARSCOV2ONAVIRUS 2 AG: NEGATIVE

## 2021-11-11 MED ORDER — ZIPRASIDONE MESYLATE 20 MG IM SOLR: 20.0000 mg | INTRAMUSCULAR | Status: DC | PRN

## 2021-11-11 MED ORDER — LORAZEPAM 1 MG PO TABS: 1.0000 mg | ORAL_TABLET | ORAL | Status: DC | PRN

## 2021-11-11 MED ORDER — ADULT MULTIVITAMIN W/MINERALS CH
1.0000 | ORAL_TABLET | Freq: Every day | ORAL | Status: DC
  Administered 2021-11-11 – 2021-11-12 (×2): 1 via ORAL
  Filled 2021-11-11 (×4): qty 1

## 2021-11-11 MED ORDER — ACETAMINOPHEN 325 MG PO TABS
650.0000 mg | ORAL_TABLET | Freq: Four times a day (QID) | ORAL | Status: DC | PRN
  Administered 2021-11-15: 650 mg via ORAL
  Filled 2021-11-11: qty 2

## 2021-11-11 MED ORDER — ONDANSETRON 4 MG PO TBDP: 4.0000 mg | ORAL_TABLET | Freq: Four times a day (QID) | ORAL | Status: AC | PRN

## 2021-11-11 MED ORDER — ALUM & MAG HYDROXIDE-SIMETH 200-200-20 MG/5ML PO SUSP
30.0000 mL | ORAL | Status: DC | PRN
Start: 2021-11-11 — End: 2021-11-15

## 2021-11-11 MED ORDER — LOPERAMIDE HCL 2 MG PO CAPS: 2.0000 mg | ORAL_CAPSULE | ORAL | Status: AC | PRN

## 2021-11-11 MED ORDER — LORAZEPAM 1 MG PO TABS: 1.0000 mg | ORAL_TABLET | Freq: Four times a day (QID) | ORAL | Status: AC | PRN

## 2021-11-11 MED ORDER — DOXEPIN HCL 10 MG PO CAPS: 10.0000 mg | ORAL_CAPSULE | Freq: Every evening | ORAL | Status: DC | PRN

## 2021-11-11 MED ORDER — NICOTINE 14 MG/24HR TD PT24
14.0000 mg | MEDICATED_PATCH | Freq: Every day | TRANSDERMAL | Status: DC
  Administered 2021-11-12: 14 mg via TRANSDERMAL
  Filled 2021-11-11 (×3): qty 1

## 2021-11-11 MED ORDER — OLANZAPINE 5 MG PO TBDP: 5.0000 mg | ORAL_TABLET | Freq: Three times a day (TID) | ORAL | Status: DC | PRN

## 2021-11-11 MED ORDER — HYDROXYZINE HCL 25 MG PO TABS
25.0000 mg | ORAL_TABLET | Freq: Four times a day (QID) | ORAL | Status: AC | PRN
  Filled 2021-11-11: qty 1

## 2021-11-11 MED ORDER — RAMELTEON 8 MG PO TABS
8.0000 mg | ORAL_TABLET | Freq: Every evening | ORAL | Status: DC | PRN
  Filled 2021-11-11: qty 1

## 2021-11-11 MED ORDER — THIAMINE HCL 100 MG/ML IJ SOLN
100.0000 mg | Freq: Once | INTRAMUSCULAR | Status: AC
  Administered 2021-11-11: 100 mg via INTRAMUSCULAR
  Filled 2021-11-11: qty 2

## 2021-11-11 MED ORDER — THIAMINE HCL 100 MG PO TABS
100.0000 mg | ORAL_TABLET | Freq: Every day | ORAL | Status: DC
  Administered 2021-11-12: 100 mg via ORAL
  Filled 2021-11-11 (×3): qty 1

## 2021-11-11 MED ORDER — MAGNESIUM HYDROXIDE 400 MG/5ML PO SUSP: 30.0000 mL | Freq: Every day | ORAL | Status: DC | PRN

## 2021-11-11 NOTE — Progress Notes (Signed)
   11/11/21 1613  [facility] Triage Screening (Walk-ins at [facility] only)  How Did You Hear About [place]? Self  What Is the Reason for Your Visit/Call Today? Patient is hearing impaired and required ASL interpreter service for assessment.  He presents requesing "meth rehab."  He states he has been using daily for 30 years and then clarifies he had one period of sobriety for 2 years "a long time ago" following rehab with [facility].  Patient denies SI, HI or AVH.  How Long Has This Been Causing You Problems? > than 6 months  Have You Recently Had Any Thoughts About Hurting Yourself? No  Are You Planning to Commit Suicide/Harm Yourself At This time? No  Have you Recently Had Thoughts About Hurting Someone Karolee Ohs? No  Are You Planning To Harm Someone At This Time? No  Are you currently experiencing any auditory, visual or other hallucinations? No  Have You Used Any Alcohol or Drugs in the Past 24 Hours? Yes  How long ago did you use Drugs or Alcohol? yesterday  What Did You Use and How Much? meth, approx $20 worth  Do you have any current medical co-morbidities that require immediate attention? No  Clinician description of patient physical appearance/behavior: Patient is a bit irritable, requests food and something to drink during triage.  He is AAOx5  What Do You Feel Would Help You the Most Today? Alcohol or Drug Use Treatment  If access to [facility] was not available, would you have sought care in the Emergency Department? No  Determination of Need Urgent (48 hours)  Options For Referral [facility];Facility-Based Crisis

## 2021-11-11 NOTE — ED Provider Notes (Signed)
[facility] H&P  Date: 11/11/21 Patient Name: Carl Skinner MRN: [id number] Chief Complaint:  No chief complaint on file.    Diagnoses:  Final diagnoses:  None    HPI: Carl Skinner is a homeless 51 y.o. male with PMHx of traumatic brain injury and past psychiatric history of GAD and stimulant use disorder who presented by self voluntarily as a direct admit from the community to [facility] (11/11/2021) seeking resources for rehab for his methamphetamine use.  The interview was performed in the presence of a qualified interpreter via tele-health  The patient endorses a chronic substance use problem, namely, methamphetamines. He presented to the psychiatric urgent care expressing an interest in getting help for his substance use, mentioning that he feels he is "too old for this." His chief complaint appears to be long-term methamphetamine abuse, with a history of use spanning 30 years. He reports daily methamphetamine use of an estimated $20 per day and states that his use has been continuous, except for a two-year period of sobriety following a stint in rehab, which he cannot recall the exact date of.  In addition to these substances, the patient also uses marijuana daily but couldn't quantify the amount, and he smokes tobacco at a rate of a pack per day for the last 20 years. He drinks alcohol, but frequency and quantity are not well defined. There are no indications of alcohol withdrawal symptoms such as shakes or hallucinations after not drinking for a while.  The patient denies any thoughts of self-harm, suicide, or harm to others, and there are no reports of auditory or visual hallucinations.   As for prescribed medications, the patient mentions ADHD and depression medications, but he has not been taking them due to financial constraints. He was not able to accurately recall the names, only offering the starting letter 'M' for  the ADHD medication and 'S' for the depression medication. He is not currently under the care of a therapist but did use to see a psychiatrist. There is a history of one mental health hospitalization, during which he needed help with medication, eating, and sleeping. There are no known medication allergies.  His last known residence was a hotel, and he does not currently have any local support system in place. He is currently unemployed and not connected to any disability services. The patient has a child, but does not maintain any contact.  Patient asked for food twice during the interview.  Substance Use History  The patient has a substantial history of substance abuse, primarily methamphetamine, for 30 years with daily use estimated at $20 per day. There was a two-year period of sobriety following rehab. He has been using crack cocaine since last week, though prior use is also noted. He uses marijuana and smokes tobacco daily (a pack per day for the past 20 years). Alcohol use is present, with the last known drink two or three days ago, but the quantity and frequency are unclear. The patient endorses experiencing one seizure episode after drug use, though it is uncertain which substance was involved. He denies any injection drug use. He denies alcohol withdrawal symptoms such as shakes or hallucinations after periods of not drinking.  PHQ 2-9:  Flow[facility] Row [facility]  Thoughts that you would be better off dead, or of  hurting yourself in some way Not at all Not at all Not at all  PHQ-9 Total Score 6 7 10        Flowsheet Row ED from 11/11/2021 in [facility] ED from 11/10/2021 in [facility] ED from 11/04/2021 in [facility]  C-SSRS RISK CATEGORY No Risk No Risk No Risk        Total Time spent with patient: 30 minutes  Psychiatric Specialty Exam  Presentation General Appearance: Disheveled   Eye Contact:Good   Speech:Clear and Coherent; Normal Rate   Speech Volume:-- (undetermined - patient deaf, does not communicate with words)   Handedness:-- (not assessed)    Mood and Affect  Mood:Euthymic   Affect:Appropriate; Congruent; Full Range    Thought Process  Thought Processes:Coherent; Linear; Goal Directed   Descriptions of Associations:Intact   Orientation:Full (Time, Place and Person)   Thought Content:Logical; WDL   Diagnosis of Schizophrenia or Schizoaffective disorder in past: No    Hallucinations:Hallucinations: None   Ideas of Reference:None   Suicidal Thoughts:Suicidal Thoughts: No   Homicidal Thoughts:Homicidal Thoughts: No    Sensorium  Memory:Immediate Good; Recent Good; Remote Good   Judgment:Good   Insight:Fair    Executive Functions  Concentration:Good   Attention Span:Good   Recall:Good   Fund of Knowledge:Good   Language:Good    Psychomotor Activity  Psychomotor Activity:Psychomotor Activity: Restlessness    Assets  Assets:Desire for Improvement; Resilience    Sleep  Sleep:Sleep: Good    Nutritional Assessment (For [facility] and [facility] admissions only) Has the patient had a weight loss or gain of 10 pounds or more in the last 3 months?: No Has the patient had a decrease in food intake/or appetite?: No Does the patient have dental problems?: No Does the patient have eating habits or behaviors that may be indicators of an eating disorder including binging or inducing vomiting?: No Has the patient recently lost weight without trying?: 0 Has the patient been eating poorly because of a decreased appetite?: 0 Malnutrition Screening Tool Score: 0    Physical Exam ROS  Blood pressure 115/65, pulse 68, temperature 97.7  F (36.5 C), temperature source Oral, resp. rate 18, SpO2 100 %. There is no height or weight on file to calculate BMI.  Past Psychiatric History: GAD, depression  Is the patient at risk to self? No  Has the patient been a risk to self in the past 6 months? No .    Has the patient been a risk to self within the distant past?  unknown   Is the patient a risk to others? No   Has the patient been a risk to others in the past 6 months? No   Has the patient been a risk to others within the distant past?  No  Past Medical History:  Past Medical History:  Diagnosis Date   Anxiety    Deaf    Depression    Substance abuse [facility])     Past Surgical History:  Procedure Laterality Date   BICEPT TENODESIS Left 06/20/2019   Procedure: BICEPS TENODESIS;  Surgeon: 08/20/2019, MD;  Location: [facility];  Service: Orthopedics;  Laterality: Left;   NOSE SURGERY     ROTATOR CUFF REPAIR Left   make patient  Family History:  Family History  Problem Relation Age of Onset   Healthy Mother    Drug abuse Mother    Healthy Father    Drug  abuse Sister    Drug abuse Maternal Uncle     Social History:  Social History   Socioeconomic History   Marital status: Unknown    Spouse name: Not on file   Number of children: Not on file   Years of education: Not on file   Highest education level: Not on file  Occupational History   Not on file  Tobacco Use   Smoking status: Every Day    Packs/day: 0.50    Types: Cigarettes   Smokeless tobacco: Never  Substance and Sexual Activity   Alcohol use: Not Currently   Drug use: Yes    Types: Methamphetamines    Comment: "currently high on meth" - states used this AM   Sexual activity: Not on file  Other Topics Concern   Not on file  Social History Narrative   Not on file   Social Determinants of Health   Financial Resource Strain: Not on file  Food Insecurity: Not on file  Transportation Needs: Not on file  Physical Activity: Not  on file  Stress: Not on file  Social Connections: Not on file  Intimate Partner Violence: Not on file    SDOH:  SDOH Screenings   Alcohol Screen: Not on file  Depression (PHQ2-9): Medium Risk (11/11/2021)   Depression (PHQ2-9)    PHQ-2 Score: 6  Financial Resource Strain: Not on file  Food Insecurity: Not on file  Housing: Not on file  Physical Activity: Not on file  Social Connections: Not on file  Stress: Not on file  Tobacco Use: High Risk (11/10/2021)   Patient History    Smoking Tobacco Use: Every Day    Smokeless Tobacco Use: Never    Passive Exposure: Not on file  Transportation Needs: Not on file    Last Labs:  Admission on 11/11/2021  Component Date Value Ref Range Status   SARSCOV2ONAVIRUS 2 AG 11/11/2021 NEGATIVE  NEGATIVE Final   Comment: (NOTE) SARS-CoV-2 antigen NOT DETECTED.   Negative results are presumptive.  Negative results do not preclude SARS-CoV-2 infection and should not be used as the sole basis for treatment or other patient management decisions, including infection  control decisions, particularly in the presence of clinical signs and  symptoms consistent with COVID-19, or in those who have been in contact with the virus.  Negative results must be combined with clinical observations, patient history, and epidemiological information. The expected result is Negative.  Fact Sheet for Patients: https://www.jennings-kim.com/  Fact Sheet for Healthcare Providers: https://alexander-rogers.biz/  This test is not yet approved or cleared by the [place] FDA and  has been authorized for detection and/or diagnosis of SARS-CoV-2 by FDA under an Emergency Use Authorization (EUA).  This EUA will remain in effect (meaning this test can be used) for the duration of  the COV                          ID-19 declaration under Section 564(b)(1) of the Act, 21 U.S.C. section 360bbb-3(b)(1), unless the authorization is terminated or  revoked sooner.    Admission on 11/04/2021, Discharged on 11/04/2021  Component Date Value Ref Range Status   Sodium 11/04/2021 140  135 - 145 mmol/L Final   Potassium 11/04/2021 3.0 (L)  3.5 - 5.1 mmol/L Final   Chloride 11/04/2021 103  98 - 111 mmol/L Final   CO2 11/04/2021 29  22 - 32 mmol/L Final   Glucose, Bld 11/04/2021 136 (H)  70 -  99 mg/dL Final   Glucose reference range applies only to samples taken after fasting for at least 8 hours.   BUN 11/04/2021 21 (H)  6 - 20 mg/dL Final   Creatinine, Ser 11/04/2021 1.00  0.61 - 1.24 mg/dL Final   Calcium 83/15/1761 8.3 (L)  8.9 - 10.3 mg/dL Final   GFR, Estimated 11/04/2021 >60  >60 mL/min Final   Comment: (NOTE) Calculated using the CKD-EPI Creatinine Equation (2021)    Anion gap 11/04/2021 8  5 - 15 Final   Performed at [facility], [address], [place], [place] [address]   WBC 11/04/2021 7.8  4.0 - 10.5 K/uL Final   RBC 11/04/2021 4.91  4.22 - 5.81 MIL/uL Final   Hemoglobin 11/04/2021 14.6  13.0 - 17.0 g/dL Final   HCT 10/62/6948 44.2  39.0 - 52.0 % Final   MCV 11/04/2021 90.0  80.0 - 100.0 fL Final   MCH 11/04/2021 29.7  26.0 - 34.0 pg Final   MCHC 11/04/2021 33.0  30.0 - 36.0 g/dL Final   RDW 54/62/7035 13.5  11.5 - 15.5 % Final   Platelets 11/04/2021 207  150 - 400 K/uL Final   nRBC 11/04/2021 0.0  0.0 - 0.2 % Final   Neutrophils Relative % 11/04/2021 77  % Final   Neutro Abs 11/04/2021 6.0  1.7 - 7.7 K/uL Final   Lymphocytes Relative 11/04/2021 13  % Final   Lymphs Abs 11/04/2021 1.0  0.7 - 4.0 K/uL Final   Monocytes Relative 11/04/2021 9  % Final   Monocytes Absolute 11/04/2021 0.7  0.1 - 1.0 K/uL Final   Eosinophils Relative 11/04/2021 1  % Final   Eosinophils Absolute 11/04/2021 0.1  0.0 - 0.5 K/uL Final   Basophils Relative 11/04/2021 0  % Final   Basophils Absolute 11/04/2021 0.0  0.0 - 0.1 K/uL Final   Immature Granulocytes 11/04/2021 0  % Final   Abs Immature Granulocytes 11/04/2021 0.03   0.00 - 0.07 K/uL Final   Performed at [facility], [address], [place], [place] [address]   Troponin I (High Sensitivity) 11/04/2021 8  <18 ng/L Final   Comment: (NOTE) Elevated high sensitivity troponin I (hsTnI) values and significant  changes across serial measurements may suggest ACS but many other  chronic and acute conditions are known to elevate hsTnI results.  Refer to the "Links" section for chest pain algorithms and additional  guidance. Performed at [facility], [address], [place], [place] [address]    Opiates 11/04/2021 NONE DETECTED  NONE DETECTED Final   Cocaine 11/04/2021 POSITIVE (A)  NONE DETECTED Final   Benzodiazepines 11/04/2021 NONE DETECTED  NONE DETECTED Final   Amphetamines 11/04/2021 POSITIVE (A)  NONE DETECTED Final   Tetrahydrocannabinol 11/04/2021 POSITIVE (A)  NONE DETECTED Final   Barbiturates 11/04/2021 NONE DETECTED  NONE DETECTED Final   Comment: (NOTE) DRUG SCREEN FOR MEDICAL PURPOSES ONLY.  IF CONFIRMATION IS NEEDED FOR ANY PURPOSE, NOTIFY LAB WITHIN 5 DAYS.  LOWEST DETECTABLE LIMITS FOR URINE DRUG SCREEN Drug Class                     Cutoff (ng/mL) Amphetamine and metabolites    1000 Barbiturate and metabolites    200 Benzodiazepine                 200 Tricyclics and metabolites     300 Opiates and metabolites        300 Cocaine  and metabolites        300 THC                            50 Performed at [facility], [address], [place], [place] [address]    Alcohol, Ethyl (B) 11/04/2021 <10  <10 mg/dL Final   Comment: (NOTE) Lowest detectable limit for serum alcohol is 10 mg/dL.  For medical purposes only. Performed at [facility], [address], [place], [place] [address]     Allergies: Shrimp [shellfish allergy] and Shrimp extract allergy skin test  PTA Medications: (Not in a hospital admission)  Medical Decision Making  Rationale for  Continuous Observation: Patient expresses intent to go to rehab for methamphetamine use disorder. His request for food twice during the interview, frequent ED visits, and homelessness raises suspicion for malingering behavior.  Plan is to refer to rehabilitation facility tomorrow after observation overnight.    Recommendations  Based on my evaluation the patient does not appear to have an emergency medical condition.  I discussed my assessment and planned treatment for the patient with Dr. Lucianne Muss who agrees with my formulated course of action.  Augusto Gamble, MD 11/11/21  5:07 PM

## 2021-11-11 NOTE — ED Notes (Signed)
Pt is awake and alert.  He has bright anxious affect and labile mood. Pt is deaf.  Pt does have good eye contact and communicates with hand gestures and witting.  An attempt to draw blood was performed with no success in Rt arm.  Pt denied ability to void at time of admission.  EKG performed.  He was searched and belongings placed in locker 17. Brought onto the unit and given meal.  Pt again denied ability to void. 2nd RN attempted blood draw without success. Pt was encouraged to drink fluids. He is currently resting quietly. Staff is monitoring for safety.

## 2021-11-11 NOTE — BH Assessment (Signed)
Comprehensive Clinical Assessment (CCA) Note  11/11/2021 Catalino Gurry [id number]  Disposition: Per Dr. Edd Arbour, admission to Continuous Assessment at [facility] is recommended for further monitoring/evaluation with AM reassessment to determine the most appropriate disposition plan.  Patient may be considered for FBC.   The patient demonstrates the following risk factors for suicide: Chronic risk factors for suicide include: psychiatric disorder of Depressive Disorder Unspecified and substance use disorder. Acute risk factors for suicide include: social withdrawal/isolation and loss (financial, interpersonal, professional). Protective factors for this patient include: hope for the future. Considering these factors, the overall suicide risk at this point appears to be low. Patient is appropriate for outpatient follow up once stabilized.   Patient is a 51 year old male with hearing impairment, requiring ASL interpreter service, with a history of polysubstance abuse who presents voluntarily to [facility] for assessment.  He presents requesting "meth rehab."  He states he has been using daily for 30 years and then clarifies he had one period of sobriety for 2 years "a long time ago" following [facility].  Patient has also been using crack cocaine 2-3 times per week for the last two weeks.  He uses THC daily.  Patient receives SSDI and stays in hotels.  He is likley out of funds before his next check, as he is asking for food and drink currently.  He reports hx of rehab treatment at [facility] "years ago."  He is a poor historian and offers limited information regarding use history of patterns of use.  Patient states he has had one past seizure, "years ago when I was using."  No other withdrawal sx reported.  Patient denies SI, HI or AVH.  Treatment options were discussed with patient.  Admission to observation is recommended with consideration for admission to [facility] upon  reassessment by psychiatry in the morning.     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, Cocaine, moderate                              F32.9 Depressive Disorder Unspecified  Flowsheet Row ED from 11/11/2021 in [facility] Office Visit from 07/13/2020 in [place] Office Visit from 07/10/2020 in [facility]  Thoughts that you would be better off dead, or of hurting yourself in some way Not at all Not at all Not at all  PHQ-9 Total Score 6 7 10       Flowsheet Row ED from 11/11/2021 in [facility] ED from 11/10/2021 in [facility] ED from 11/04/2021 in [place] DEPT  C-SSRS RISK CATEGORY No Risk No Risk No Risk       CCA Screening, Triage and Referral (STR)  Patient Reported Information How did you hear about [place]? Self  What Is the Reason for Your Visit/Call Today? Patient is hearing impaired and required ASL interpreter service for assessment.  He presents requesing "meth rehab."  He states he has been using daily for 30 years and then clarifies he had one period of sobriety for 2 years "a long time ago" following rehab with [facility]  program.  Patient denies SI, HI or AVH.  How Long Has This Been Causing You Problems? > than 6 months  What Do You Feel Would Help You the Most Today? Alcohol or Drug Use Treatment   Have You Recently Had Any Thoughts About Hurting Yourself? No  Are You Planning to Commit Suicide/Harm Yourself At This time? No   Have you Recently Had Thoughts About Hurting Someone Karolee Ohs? No  Are You Planning to Harm Someone at This Time? No  Explanation: No data recorded  Have You Used Any Alcohol or Drugs in the Past 24 Hours? Yes  How Long Ago Did You Use  Drugs or Alcohol? No data recorded What Did You Use and How Much? meth, approx $20 worth   Do You Currently Have a Therapist/Psychiatrist? No  Name of Therapist/Psychiatrist: No data recorded  Have You Been Recently Discharged From Any Office Practice or Programs? No  Explanation of Discharge From Practice/Program: No data recorded    CCA Screening Triage Referral Assessment Type of Contact: Face-to-Face  Telemedicine Service Delivery:   Is this Initial or Reassessment? No data recorded Date Telepsych consult ordered in [facility]:  No data recorded Time Telepsych consult ordered in [facility]:  No data recorded Location of Assessment: [facility] Services  Provider Location: [facility]   Collateral Involvement: N/A - Patient states he has no support   Does Patient Have a Automotive engineer Guardian? No data recorded Name and Contact of Legal Guardian: No data recorded If Minor and Not Living with Parent(s), Who has Custody? No data recorded Is CPS involved or ever been involved? Never  Is APS involved or ever been involved? Never   Patient Determined To Be At Risk for Harm To Self or Others Based on Review of Patient Reported Information or Presenting Complaint? No  Method: No data recorded Availability of Means: No data recorded Intent: No data recorded Notification Required: No data recorded Additional Information for Danger to Others Potential: No data recorded Additional Comments for Danger to Others Potential: No data recorded Are There Guns or Other Weapons in Your Home? No data recorded Types of Guns/Weapons: No data recorded Are These Weapons Safely Secured?                            No data recorded Who Could Verify You Are Able To Have These Secured: No data recorded Do You Have any Outstanding Charges, Pending Court Dates, Parole/Probation? No data recorded Contacted To Inform of Risk of Harm To Self or Others: No data recorded   Does  Patient Present under Involuntary Commitment? No  IVC Papers Initial File Date: No data recorded  [place] of Residence: [place]   Patient Currently Receiving the Following Services: Not Receiving Services   Determination of Need: Urgent (48 hours)   Options For Referral: [facility]; Facility-Based Crisis     CCA Biopsychosocial Patient Reported Schizophrenia/Schizoaffective Diagnosis in Past: No   Strengths: Patient is seeking treatment, states he is motivated to get clean   Mental Health Symptoms Depression:   Difficulty Concentrating; Irritability   Duration of Depressive symptoms:  Duration of Depressive Symptoms: Greater than two weeks   Mania:   None   Anxiety:    Worrying   Psychosis:   None   Duration of Psychotic symptoms:    Trauma:   None   Obsessions:   None   Compulsions:   None  Inattention:   N/A   Hyperactivity/Impulsivity:   N/A   Oppositional/Defiant Behaviors:   N/A   Emotional Irregularity:  No data recorded  Other Mood/Personality Symptoms:  No data recorded   Mental Status Exam Appearance and self-care  Stature:   Average   Weight:   Average weight   Clothing:   Disheveled   Grooming:   Neglected   Cosmetic use:   None   Posture/gait:   Normal   Motor activity:   Not Remarkable   Sensorium  Attention:   Normal   Concentration:   Normal   Orientation:   X5   Recall/memory:   Normal   Affect and Mood  Affect:   Depressed   Mood:   Depressed   Relating  Eye contact:   Fleeting   Facial expression:   Depressed   Attitude toward examiner:   Cooperative   Thought and Language  Speech flow:  -- (Hearing impaired - N/A)   Thought content:   Appropriate to Mood and Circumstances   Preoccupation:   None   Hallucinations:   None   Organization:  No data recorded  [employer] of Knowledge:   Average   Intelligence:   Average   Abstraction:   Normal    Judgement:   Impaired   Reality Testing:   Adequate   Insight:   Gaps   Decision Making:   Impulsive; Vacilates   Social Functioning  Social Maturity:   Isolates   Social Judgement:   Normal   Stress  Stressors:   Housing; Teacher, music Ability:   Deficient supports; Exhausted   Skill Deficits:   Communication; Decision making; Interpersonal; Self-control   Supports:   Support needed     Religion: Religion/Spirituality Are You A Religious Person?: No  Leisure/Recreation: Leisure / Recreation Do You Have Hobbies?: No  Exercise/Diet: Exercise/Diet Do You Exercise?: No Have You Gained or Lost A Significant Amount of Weight in the Past Six Months?: No Do You Follow a Special Diet?: No Do You Have Any Trouble Sleeping?: No   CCA Employment/Education Employment/Work Situation: Employment / Work Technical sales engineer: On disability Why is Patient on Disability: Hearing impaired How Long has Patient Been on Disability: most of life Has Patient ever Been in the [employer]?: No  Education: Education Is Patient Currently Attending School?: No Last Grade Completed: 12 Did You Attend College?: No Did You Have An Individualized Education Program (IIEP): No Did You Have Any Difficulty At School?: No Patient's Education Has Been Impacted by Current Illness: No   CCA Family/Childhood History Family and Relationship History: Family history Marital status: Single Does patient have children?: Yes How many children?: 1 How is patient's relationship with their children?: No contact or relationship with child  Childhood History:  Childhood History By whom was/is the patient raised?: Mother, Father Did patient suffer any verbal/emotional/physical/sexual abuse as a child?: No Did patient suffer from severe childhood neglect?: No Has patient ever been sexually abused/assaulted/raped as an adolescent or adult?: No Was the patient ever a victim of a  crime or a disaster?: No Witnessed domestic violence?: No Has patient been affected by domestic violence as an adult?: No  Child/Adolescent Assessment:     CCA Substance Use Alcohol/Drug Use: Alcohol / Drug Use Pain Medications: See MAR Prescriptions: See MAR Over the Counter: See MAR History of alcohol / drug use?: Yes Longest period of sobriety (when/how long): 2 years - "many years ago?" unclear Negative Consequences  of Use: Financial Withdrawal Symptoms: None Substance #1 Name of Substance 1: Meth 1 - Age of First Use: 21 1 - Amount (size/oz): $20 1 - Frequency: daily 1 - Duration: "30 years" - outside of 2 yr clean "years ago' 1 - Last Use / Amount: yesterday 1 - Method of Aquiring: NA 1- Route of Use: smokes Substance #2 Name of Substance 2: crack cocaine 2 - Age of First Use: 21 2 - Amount (size/oz): varies 2 - Frequency: 2-3 x/wk 2 - Duration: 2 wks - use in past 2 - Last Use / Amount: yesterday 2 - Method of Aquiring: NA 2 - Route of Substance Use: NA Substance #3 Name of Substance 3: THC 3 - Age of First Use: 21 3 - Amount (size/oz): 2-3 blunts 3 - Frequency: daily 3 - Duration: "years" 3 - Last Use / Amount: today 3 - Method of Aquiring: NA 3 - Route of Substance Use: smokes                   ASAM's:  Six Dimensions of Multidimensional Assessment  Dimension 1:  Acute Intoxication and/or Withdrawal Potential:   Dimension 1:  Description of individual's past and current experiences of substance use and withdrawal: Denies hx of w/d sx, however admits to seizure while using once in the past  Dimension 2:  Biomedical Conditions and Complications:   Dimension 2:  Description of patient's biomedical conditions and  complications: Able to cope with discomfort/pain  Dimension 3:  Emotional, Behavioral, or Cognitive Conditions and Complications:  Dimension 3:  Description of emotional, behavioral, or cognitive conditions and complications: Underlying  depression, states he cannot afford meds  Dimension 4:  Readiness to Change:  Dimension 4:  Description of Readiness to Change criteria: Has not engaged in treatment for "many years"  Dimension 5:  Relapse, Continued use, or Continued Problem Potential:  Dimension 5:  Relapse, continued use, or continued problem potential critiera description: Limited insight into MI and SA issues  Dimension 6:  Recovery/Living Environment:  Dimension 6:  Recovery/Iiving environment criteria description: States he has no support  ASAM Severity Score: ASAM's Severity Rating Score: 11  ASAM Recommended Level of Treatment: ASAM Recommended Level of Treatment: Level II Intensive Outpatient Treatment   Substance use Disorder (SUD) Substance Use Disorder (SUD)  Checklist Symptoms of Substance Use: Continued use despite having a persistent/recurrent physical/psychological problem caused/exacerbated by use, Continued use despite persistent or recurrent social, interpersonal problems, caused or exacerbated by use, Presence of craving or strong urge to use, Recurrent use that results in a failure to fulfill major role obligations (work, school, home)  Recommendations for Services/Supports/Treatments: Recommendations for Services/Supports/Treatments Recommendations For Services/Supports/Treatments: CD-IOP Special educational needs teacher, Facility Based Crisis  Discharge Disposition:    DSM5 Diagnoses: Patient Active Problem List   Diagnosis Date Noted   Moderate episode of recurrent major depressive disorder (HCC) 07/10/2020   Generalized anxiety disorder 07/10/2020   Agitation 07/10/2020     Referrals to Alternative Service(s): Referred to Alternative Service(s):   Place:   Date:   Time:    Referred to Alternative Service(s):   Place:   Date:   Time:    Referred to Alternative Service(s):   Place:   Date:   Time:    Referred to Alternative Service(s):   Place:   Date:   Time:     Yetta Glassman,  [facility]

## 2021-11-11 NOTE — ED Notes (Signed)
Pt arrived to the unit at 4:45

## 2021-11-12 DIAGNOSIS — F1520 Other stimulant dependence, uncomplicated: Secondary | ICD-10-CM | POA: Diagnosis not present

## 2021-11-12 LAB — CBC WITH DIFFERENTIAL/PLATELET
Abs Immature Granulocytes: 0.01 10*3/uL (ref 0.00–0.07)
Basophils Absolute: 0 10*3/uL (ref 0.0–0.1)
Basophils Relative: 1 %
Eosinophils Absolute: 0.1 10*3/uL (ref 0.0–0.5)
Eosinophils Relative: 2 %
HCT: 43 % (ref 39.0–52.0)
Hemoglobin: 14.4 g/dL (ref 13.0–17.0)
Immature Granulocytes: 0 %
Lymphocytes Relative: 42 %
Lymphs Abs: 2.1 10*3/uL (ref 0.7–4.0)
MCH: 29.3 pg (ref 26.0–34.0)
MCHC: 33.5 g/dL (ref 30.0–36.0)
MCV: 87.4 fL (ref 80.0–100.0)
Monocytes Absolute: 0.4 10*3/uL (ref 0.1–1.0)
Monocytes Relative: 8 %
Neutro Abs: 2.4 10*3/uL (ref 1.7–7.7)
Neutrophils Relative %: 47 %
Platelets: 307 10*3/uL (ref 150–400)
RBC: 4.92 MIL/uL (ref 4.22–5.81)
RDW: 13 % (ref 11.5–15.5)
WBC: 5.1 10*3/uL (ref 4.0–10.5)
nRBC: 0 % (ref 0.0–0.2)

## 2021-11-12 LAB — PHOSPHORUS: Phosphorus: 2.6 mg/dL (ref 2.5–4.6)

## 2021-11-12 LAB — LIPID PANEL
Cholesterol: 147 mg/dL (ref 0–200)
HDL: 31 mg/dL — ABNORMAL LOW (ref >40)
LDL Cholesterol: 100 mg/dL — ABNORMAL HIGH (ref 0–99)
Total CHOL/HDL Ratio: 4.7 RATIO
Triglycerides: 81 mg/dL (ref <150)
VLDL: 16 mg/dL (ref 0–40)

## 2021-11-12 LAB — COMPREHENSIVE METABOLIC PANEL
ALT: 24 U/L (ref 0–44)
AST: 18 U/L (ref 15–41)
Albumin: 2.9 g/dL — ABNORMAL LOW (ref 3.5–5.0)
Alkaline Phosphatase: 57 U/L (ref 38–126)
Anion gap: 8 (ref 5–15)
BUN: 14 mg/dL (ref 6–20)
CO2: 27 mmol/L (ref 22–32)
Calcium: 8.6 mg/dL — ABNORMAL LOW (ref 8.9–10.3)
Chloride: 103 mmol/L (ref 98–111)
Creatinine, Ser: 0.91 mg/dL (ref 0.61–1.24)
GFR, Estimated: >60 mL/min (ref >60)
Glucose, Bld: 122 mg/dL — ABNORMAL HIGH (ref 70–99)
Potassium: 3.1 mmol/L — ABNORMAL LOW (ref 3.5–5.1)
Sodium: 138 mmol/L (ref 135–145)
Total Bilirubin: 0.8 mg/dL (ref 0.3–1.2)
Total Protein: 6.2 g/dL — ABNORMAL LOW (ref 6.5–8.1)

## 2021-11-12 LAB — POCT URINE DRUG SCREEN - MANUAL ENTRY (I-SCREEN)
POC Amphetamine UR: NOT DETECTED
POC Buprenorphine (BUP): NOT DETECTED
POC Cocaine UR: NOT DETECTED
POC Marijuana UR: POSITIVE — AB
POC Methadone UR: NOT DETECTED
POC Methamphetamine UR: NOT DETECTED
POC Morphine: NOT DETECTED
POC Oxazepam (BZO): NOT DETECTED
POC Oxycodone UR: NOT DETECTED
POC Secobarbital (BAR): NOT DETECTED

## 2021-11-12 LAB — HEMOGLOBIN A1C
Hgb A1c MFr Bld: 5.5 % (ref 4.8–5.6)
Mean Plasma Glucose: 111.15 mg/dL

## 2021-11-12 LAB — T4, FREE: Free T4: 1.2 ng/dL — ABNORMAL HIGH (ref 0.61–1.12)

## 2021-11-12 LAB — ETHANOL: Alcohol, Ethyl (B): <10 mg/dL (ref <10)

## 2021-11-12 LAB — TSH: TSH: 0.158 u[IU]/mL — ABNORMAL LOW (ref 0.350–4.500)

## 2021-11-12 LAB — MAGNESIUM: Magnesium: 2.2 mg/dL (ref 1.7–2.4)

## 2021-11-12 MED ORDER — POTASSIUM CHLORIDE CRYS ER 20 MEQ PO TBCR
60.0000 meq | EXTENDED_RELEASE_TABLET | Freq: Once | ORAL | Status: AC
  Administered 2021-11-12: 60 meq via ORAL
  Filled 2021-11-12: qty 3

## 2021-11-12 MED ORDER — POTASSIUM CHLORIDE CRYS ER 20 MEQ PO TBCR: 80.0000 meq | EXTENDED_RELEASE_TABLET | Freq: Once | ORAL | Status: DC

## 2021-11-12 NOTE — ED Notes (Signed)
Pt sleeping@this time. Breathing even and unlabored. Will continue to monitor for safety 

## 2021-11-12 NOTE — ED Notes (Signed)
Pt is in the bed sleeping. Respirations are even and unlabored. No acute distress noted. Will continue to monitor for safety. 

## 2021-11-12 NOTE — ED Notes (Signed)
Pt ate his dinner after which he took a shower

## 2021-11-12 NOTE — ED Notes (Signed)
Pt sleeping no distress or disturbed sleep noted respiration are easy and skin color  appropriate for ethnicity.

## 2021-11-12 NOTE — BH Assessment (Signed)
LCSW Progress Note   LCSW attempted to contact [facility],[phone], to speak about what accommodations they have for the hearing impaired.  A voicemail was left for each contact requesting a call back.  LCSW contacted [facility] regarding accommodations for the hearing impaired.  LCSW was informed they do not.  LCSW spoke with the pt to confirm what he said in his assessment about [facility] as part [facility] his aftercare plan.  Pt stated that he is interested in going to the [employer].  [phone] - LCSW contacted [facility] to inquire about residential treatment facilities that can accommodate those who are hearing impaired.

## 2021-11-12 NOTE — ED Notes (Signed)
Eye drops Restasis not available in [facility] pharmacy notified.

## 2021-11-12 NOTE — ED Notes (Signed)
Called [facility] lab dept to add-on newly ordered labs.

## 2021-11-12 NOTE — ED Notes (Signed)
Pt sleeping in no acute distress. RR even and unlabored. Safety maintained. 

## 2021-11-12 NOTE — ED Notes (Signed)
Pt resting in bed. A&O x4, calm and cooperative. Denies SI/HI/AVH. No signs of acute distress noted. Will continue to monitor for safety.  

## 2021-11-12 NOTE — ED Notes (Signed)
Pt approached nurses station requesting something to eat. Pt deaf and identifies by reading lips and writing on white board. Denies SI/HI/AVH. Denies withdrawal sx from meth. Cooperative with staff. Informed pt to notify staff with any needs or concerns. Received all am medication without difficulty. Will monitor for safety.

## 2021-11-12 NOTE — ED Notes (Signed)
Asked provider to review for [facility]

## 2021-11-12 NOTE — ED Provider Notes (Cosign Needed Addendum)
Behavioral Health Progress Note  Date and Time: 11/12/2021 5:06 PM Name: Carl Skinner MRN:  [id number]  Carl Skinner is a homeless 51 y.o. male with PMHx of traumatic brain injury and past psychiatric history of GAD and stimulant use disorder who presented by self voluntarily as a direct admit from the community to [facility]) (11/11/2021) seeking resources for rehab for his methamphetamine use.  Subjective:    Patient says he slept well, though cannot tell me the number of hours. Patient says he ate well. Not endorsing active and passive SI, not endorsing HI. Denies hallucinations.  Diagnosis:  Final diagnoses:  None    Total Time spent with patient: 20 minutes  Past Psychiatric History: GAD, stimulant use disorder Past Medical History:  Past Medical History:  Diagnosis Date   Anxiety    Deaf    Depression    Substance abuse (HCC)     Past Surgical History:  Procedure Laterality Date   BICEPT TENODESIS Left 06/20/2019   Procedure: BICEPS TENODESIS;  Surgeon: Bjorn Pippin, MD;  Location: [facility];  Service: Orthopedics;  Laterality: Left;   NOSE SURGERY     ROTATOR CUFF REPAIR Left    Family History:  Family History  Problem Relation Age of Onset   Healthy Mother    Drug abuse Mother    Healthy Father    Drug abuse Sister    Drug abuse Maternal Uncle    Social History:  Social History   Substance and Sexual Activity  Alcohol Use Not Currently     Social History   Substance and Sexual Activity  Drug Use Yes   Types: Methamphetamines   Comment: "currently high on meth" - states used this AM    Social History   Socioeconomic History   Marital status: Unknown    Spouse name: Not on file   Number of children: Not on file   Years of education: Not on file   Highest education level: Not on file  Occupational History   Not on file  Tobacco Use   Smoking status: Every Day    Packs/day: 0.50    Types: Cigarettes    Smokeless tobacco: Never  Substance and Sexual Activity   Alcohol use: Not Currently   Drug use: Yes    Types: Methamphetamines    Comment: "currently high on meth" - states used this AM   Sexual activity: Not on file  Other Topics Concern   Not on file  Social History Narrative   Not on file   Social Determinants of Health   Financial Resource Strain: Not on file  Food Insecurity: Not on file  Transportation Needs: Not on file  Physical Activity: Not on file  Stress: Not on file  Social Connections: Not on file   SDOH:  SDOH Screenings   Alcohol Screen: Not on file  Depression (PHQ2-9): Medium Risk (11/11/2021)   Depression (PHQ2-9)    PHQ-2 Score: 6  Financial Resource Strain: Not on file  Food Insecurity: Not on file  Housing: Not on file  Physical Activity: Not on file  Social Connections: Not on file  Stress: Not on file  Tobacco Use: High Risk (11/10/2021)   Patient History    Smoking Tobacco Use: Every Day    Smokeless Tobacco Use: Never    Passive Exposure: Not on file  Transportation Needs: Not on file   Additional Social History:    Pain Medications: See MAR Prescriptions: See MAR Over  the Counter: See MAR History of alcohol / drug use?: Yes Longest period of sobriety (when/how long): 2 years - "many years ago?" unclear Negative Consequences of Use: Financial Withdrawal Symptoms: None Name of Substance 1: Meth 1 - Age of First Use: 21 1 - Amount (size/oz): $20 1 - Frequency: daily 1 - Duration: "30 years" - outside of 2 yr clean "years ago' 1 - Last Use / Amount: yesterday 1 - Method of Aquiring: NA 1- Route of Use: smokes Name of Substance 2: crack cocaine 2 - Age of First Use: 21 2 - Amount (size/oz): varies 2 - Frequency: 2-3 x/wk 2 - Duration: 2 wks - use in past 2 - Last Use / Amount: yesterday 2 - Method of Aquiring: NA 2 - Route of Substance Use: NA Name of Substance 3: THC 3 - Age of First Use: 21 3 - Amount (size/oz): 2-3 blunts 3  - Frequency: daily 3 - Duration: "years" 3 - Last Use / Amount: today 3 - Method of Aquiring: NA 3 - Route of Substance Use: smokes              Sleep: Good  Appetite:  Good  Current Medications:  Current Facility-Administered Medications  Medication Dose Route Frequency Provider Last Rate Last Admin   acetaminophen (TYLENOL) tablet 650 mg  650 mg Oral Q6H PRN Augusto Gamble, MD       alum & mag hydroxide-simeth (MAALOX/MYLANTA) 200-200-20 MG/5ML suspension 30 mL  30 mL Oral Q4H PRN Augusto Gamble, MD       cycloSPORINE (RESTASIS) 0.05 % ophthalmic emulsion 1 drop  1 drop Both Eyes QHS Lamptey, Britta Mccreedy, MD       doxepin (SINEQUAN) capsule 10 mg  10 mg Oral QHS PRN Augusto Gamble, MD       hydrOXYzine (ATARAX) tablet 25 mg  25 mg Oral Q6H PRN Augusto Gamble, MD       loperamide (IMODIUM) capsule 2-4 mg  2-4 mg Oral PRN Augusto Gamble, MD       LORazepam (ATIVAN) tablet 1 mg  1 mg Oral Q6H PRN Augusto Gamble, MD       OLANZapine zydis (ZYPREXA) disintegrating tablet 5 mg  5 mg Oral Q8H PRN Augusto Gamble, MD       And   LORazepam (ATIVAN) tablet 1 mg  1 mg Oral PRN Augusto Gamble, MD       And   ziprasidone (GEODON) injection 20 mg  20 mg Intramuscular PRN Augusto Gamble, MD       magnesium hydroxide (MILK OF MAGNESIA) suspension 30 mL  30 mL Oral Daily PRN Augusto Gamble, MD       multivitamin with minerals tablet 1 tablet  1 tablet Oral Daily Augusto Gamble, MD   1 tablet at 11/12/21 0948   nicotine (NICODERM CQ - dosed in mg/24 hours) patch 14 mg  14 mg Transdermal Q0600 Augusto Gamble, MD   14 mg at 11/12/21 0948   ondansetron (ZOFRAN-ODT) disintegrating tablet 4 mg  4 mg Oral Q6H PRN Augusto Gamble, MD       ramelteon (ROZEREM) tablet 8 mg  8 mg Oral QHS PRN Augusto Gamble, MD       thiamine (VITAMIN B1) tablet 100 mg  100 mg Oral Daily Augusto Gamble, MD   100 mg at 11/12/21 8119   Current Outpatient Medications  Medication Sig Dispense Refill   atomoxetine (STRATTERA) 40 MG capsule Take 40 mg by mouth daily.      cyanocobalamin  1000 MCG tablet Take 1,000 mcg by mouth daily.     DULoxetine (CYMBALTA) 30 MG capsule Take 30 mg by mouth daily.     ibuprofen (ADVIL) 600 MG tablet Take 1 tablet (600 mg total) by mouth every 6 (six) hours as needed. (Patient taking differently: Take 600 mg by mouth every 6 (six) hours as needed for mild pain.) 30 tablet 0   LORazepam (ATIVAN) 1 MG tablet Take 1 mg by mouth 2 (two) times daily.     sertraline (ZOLOFT) 50 MG tablet Take 1 tablet (50 mg total) by mouth daily. 30 tablet 1    Labs  Lab Results:  Admission on 11/11/2021  Component Date Value Ref Range Status   SARS Coronavirus 2 by RT PCR 11/11/2021 NEGATIVE  NEGATIVE Final   Comment: (NOTE) SARS-CoV-2 target nucleic acids are NOT DETECTED.  The SARS-CoV-2 RNA is generally detectable in upper respiratory specimens during the acute phase of infection. The lowest concentration of SARS-CoV-2 viral copies this assay can detect is 138 copies/mL. A negative result does not preclude SARS-Cov-2 infection and should not be used as the sole basis for treatment or other patient management decisions. A negative result may occur with  improper specimen collection/handling, submission of specimen other than nasopharyngeal swab, presence of viral mutation(s) within the areas targeted by this assay, and inadequate number of viral copies(<138 copies/mL). A negative result must be combined with clinical observations, patient history, and epidemiological information. The expected result is Negative.  Fact Sheet for Patients:  BloggerCourse.com  Fact Sheet for Healthcare Providers:  SeriousBroker.it  This test is no                          t yet approved or cleared by the [place] FDA and  has been authorized for detection and/or diagnosis of SARS-CoV-2 by FDA under an Emergency Use Authorization (EUA). This EUA will remain  in effect (meaning this test can be used)  for the duration of the COVID-19 declaration under Section 564(b)(1) of the Act, 21 U.S.C.section 360bbb-3(b)(1), unless the authorization is terminated  or revoked sooner.       Influenza A by PCR 11/11/2021 NEGATIVE  NEGATIVE Final   Influenza B by PCR 11/11/2021 NEGATIVE  NEGATIVE Final   Comment: (NOTE) The Xpert Xpress SARS-CoV-2/FLU/RSV plus assay is intended as an aid in the diagnosis of influenza from Nasopharyngeal swab specimens and should not be used as a sole basis for treatment. Nasal washings and aspirates are unacceptable for Xpert Xpress SARS-CoV-2/FLU/RSV testing.  Fact Sheet for Patients: BloggerCourse.com  Fact Sheet for Healthcare Providers: SeriousBroker.it  This test is not yet approved or cleared by the [place] FDA and has been authorized for detection and/or diagnosis of SARS-CoV-2 by FDA under an Emergency Use Authorization (EUA). This EUA will remain in effect (meaning this test can be used) for the duration of the COVID-19 declaration under Section 564(b)(1) of the Act, 21 U.S.C. section 360bbb-3(b)(1), unless the authorization is terminated or revoked.  Performed at [facility], [address] [place], [place] [address]    WBC 11/12/2021 5.1  4.0 - 10.5 K/uL Final   RBC 11/12/2021 4.92  4.22 - 5.81 MIL/uL Final   Hemoglobin 11/12/2021 14.4  13.0 - 17.0 g/dL Final   HCT 51/88/4166 43.0  39.0 - 52.0 % Final   MCV 11/12/2021 87.4  80.0 - 100.0 fL Final   MCH 11/12/2021 29.3  26.0 - 34.0  pg Final   MCHC 11/12/2021 33.5  30.0 - 36.0 g/dL Final   RDW 67/20/9470 13.0  11.5 - 15.5 % Final   Platelets 11/12/2021 307  150 - 400 K/uL Final   nRBC 11/12/2021 0.0  0.0 - 0.2 % Final   Neutrophils Relative % 11/12/2021 47  % Final   Neutro Abs 11/12/2021 2.4  1.7 - 7.7 K/uL Final   Lymphocytes Relative 11/12/2021 42  % Final   Lymphs Abs 11/12/2021 2.1  0.7 - 4.0 K/uL Final   Monocytes Relative  11/12/2021 8  % Final   Monocytes Absolute 11/12/2021 0.4  0.1 - 1.0 K/uL Final   Eosinophils Relative 11/12/2021 2  % Final   Eosinophils Absolute 11/12/2021 0.1  0.0 - 0.5 K/uL Final   Basophils Relative 11/12/2021 1  % Final   Basophils Absolute 11/12/2021 0.0  0.0 - 0.1 K/uL Final   Immature Granulocytes 11/12/2021 0  % Final   Abs Immature Granulocytes 11/12/2021 0.01  0.00 - 0.07 K/uL Final   Performed at [facility], [address] [place], [place] [address]   Sodium 11/12/2021 138  135 - 145 mmol/L Final   Potassium 11/12/2021 3.1 (L)  3.5 - 5.1 mmol/L Final   Chloride 11/12/2021 103  98 - 111 mmol/L Final   CO2 11/12/2021 27  22 - 32 mmol/L Final   Glucose, Bld 11/12/2021 122 (H)  70 - 99 mg/dL Final   Glucose reference range applies only to samples taken after fasting for at least 8 hours.   BUN 11/12/2021 14  6 - 20 mg/dL Final   Creatinine, Ser 11/12/2021 0.91  0.61 - 1.24 mg/dL Final   Calcium 66/29/4765 8.6 (L)  8.9 - 10.3 mg/dL Final   Total Protein 46/50/3546 6.2 (L)  6.5 - 8.1 g/dL Final   Albumin 56/81/2751 2.9 (L)  3.5 - 5.0 g/dL Final   AST 70/04/7492 18  15 - 41 U/L Final   ALT 11/12/2021 24  0 - 44 U/L Final   Alkaline Phosphatase 11/12/2021 57  38 - 126 U/L Final   Total Bilirubin 11/12/2021 0.8  0.3 - 1.2 mg/dL Final   GFR, Estimated 11/12/2021 >60  >60 mL/min Final   Comment: (NOTE) Calculated using the CKD-EPI Creatinine Equation (2021)    Anion gap 11/12/2021 8  5 - 15 Final   Performed at [facility], [address] [place], [place] [address]   Hgb A1c MFr Bld 11/12/2021 5.5  4.8 - 5.6 % Final   Comment: (NOTE) Pre diabetes:          5.7%-6.4%  Diabetes:              >6.4%  Glycemic control for   <7.0% adults with diabetes    Mean Plasma Glucose 11/12/2021 111.15  mg/dL Final   Performed at [facility], [address], [place], [place] [address]   Alcohol, Ethyl (B) 11/12/2021 <10  <10 mg/dL Final   Comment: (NOTE) Lowest  detectable limit for serum alcohol is 10 mg/dL.  For medical purposes only. Performed at [facility], [address], [place], [place] [address]    TSH 11/12/2021 0.158 (L)  0.350 - 4.500 uIU/mL Final   Comment: Performed by a 3rd Generation assay with a functional sensitivity of <=0.01 uIU/mL. Performed at [facility], [address], [place], [place] [address]    POC Amphetamine UR 11/12/2021 None Detected  NONE DETECTED (Cut Off  Level 1000 ng/mL) Final   POC Secobarbital (BAR) 11/12/2021 None Detected  NONE DETECTED (Cut Off Level 300 ng/mL) Final   POC Buprenorphine (BUP) 11/12/2021 None Detected  NONE DETECTED (Cut Off Level 10 ng/mL) Final   POC Oxazepam (BZO) 11/12/2021 None Detected  NONE DETECTED (Cut Off Level 300 ng/mL) Final   POC Cocaine UR 11/12/2021 None Detected  NONE DETECTED (Cut Off Level 300 ng/mL) Final   POC Methamphetamine UR 11/12/2021 None Detected  NONE DETECTED (Cut Off Level 1000 ng/mL) Final   POC Morphine 11/12/2021 None Detected  NONE DETECTED (Cut Off Level 300 ng/mL) Final   POC Methadone UR 11/12/2021 None Detected  NONE DETECTED (Cut Off Level 300 ng/mL) Final   POC Oxycodone UR 11/12/2021 None Detected  NONE DETECTED (Cut Off Level 100 ng/mL) Final   POC Marijuana UR 11/12/2021 Positive (A)  NONE DETECTED (Cut Off Level 50 ng/mL) Final   SARSCOV2ONAVIRUS 2 AG 11/11/2021 NEGATIVE  NEGATIVE Final   Comment: (NOTE) SARS-CoV-2 antigen NOT DETECTED.   Negative results are presumptive.  Negative results do not preclude SARS-CoV-2 infection and should not be used as the sole basis for treatment or other patient management decisions, including infection  control decisions, particularly in the presence of clinical signs and  symptoms consistent with COVID-19, or in those who have been in contact with the virus.  Negative results must be combined with clinical observations, patient history, and epidemiological information. The expected result is  Negative.  Fact Sheet for Patients: https://www.jennings-kim.com/  Fact Sheet for Healthcare Providers: https://alexander-rogers.biz/  This test is not yet approved or cleared by the [place] FDA and  has been authorized for detection and/or diagnosis of SARS-CoV-2 by FDA under an Emergency Use Authorization (EUA).  This EUA will remain in effect (meaning this test can be used) for the duration of  the COV                          ID-19 declaration under Section 564(b)(1) of the Act, 21 U.S.C. section 360bbb-3(b)(1), unless the authorization is terminated or revoked sooner.     Cholesterol 11/12/2021 147  0 - 200 mg/dL Final   Triglycerides 16/01/9603 81  <150 mg/dL Final   HDL 54/12/8117 31 (L)  >40 mg/dL Final   Total CHOL/HDL Ratio 11/12/2021 4.7  RATIO Final   VLDL 11/12/2021 16  0 - 40 mg/dL Final   LDL Cholesterol 11/12/2021 100 (H)  0 - 99 mg/dL Final   Comment:        Total Cholesterol/HDL:CHD Risk Coronary Heart Disease Risk Table                     Men   Women  1/2 Average Risk   3.4   3.3  Average Risk       5.0   4.4  2 X Average Risk   9.6   7.1  3 X Average Risk  23.4   11.0        Use the calculated Patient Ratio above and the CHD Risk Table to determine the patient's CHD Risk.        ATP III CLASSIFICATION (LDL):  <100     mg/dL   Optimal  147-829  mg/dL   Near or Above                    Optimal  130-159  mg/dL   Borderline  562-130  mg/dL   High  >161>190     mg/dL   Very High Performed at [facility], [address] [place], [place] [address]    Phosphorus 11/12/2021 2.6  2.5 - 4.6 mg/dL Final   Performed at [facility], [address] [place], [place] [address]   Free T4 11/12/2021 1.20 (H)  0.61 - 1.12 ng/dL Final   Comment: (NOTE) Biotin ingestion may interfere with free T4 tests. If the results are inconsistent with the TSH level, previous test results, or the clinical presentation, then consider  biotin interference. If needed, order repeat testing after stopping biotin. Performed at [facility], [address] [place], [place] [address]    Magnesium 11/12/2021 2.2  1.7 - 2.4 mg/dL Final   Performed at [facility], [address] [place], [place] [address]  Admission on 11/04/2021, Discharged on 11/04/2021  Component Date Value Ref Range Status   Sodium 11/04/2021 140  135 - 145 mmol/L Final   Potassium 11/04/2021 3.0 (L)  3.5 - 5.1 mmol/L Final   Chloride 11/04/2021 103  98 - 111 mmol/L Final   CO2 11/04/2021 29  22 - 32 mmol/L Final   Glucose, Bld 11/04/2021 136 (H)  70 - 99 mg/dL Final   Glucose reference range applies only to samples taken after fasting for at least 8 hours.   BUN 11/04/2021 21 (H)  6 - 20 mg/dL Final   Creatinine, Ser 11/04/2021 1.00  0.61 - 1.24 mg/dL Final   Calcium 62/13/086507/20/2023 8.3 (L)  8.9 - 10.3 mg/dL Final   GFR, Estimated 11/04/2021 >60  >60 mL/min Final   Comment: (NOTE) Calculated using the CKD-EPI Creatinine Equation (2021)    Anion gap 11/04/2021 8  5 - 15 Final   Performed at [facility], [address] [place], [place] [address]   WBC 11/04/2021 7.8  4.0 - 10.5 K/uL Final   RBC 11/04/2021 4.91  4.22 - 5.81 MIL/uL Final   Hemoglobin 11/04/2021 14.6  13.0 - 17.0 g/dL Final   HCT 62/95/284107/20/2023 44.2  39.0 - 52.0 % Final   MCV 11/04/2021 90.0  80.0 - 100.0 fL Final   MCH 11/04/2021 29.7  26.0 - 34.0 pg Final   MCHC 11/04/2021 33.0  30.0 - 36.0 g/dL Final   RDW 32/44/010207/20/2023 13.5  11.5 - 15.5 % Final   Platelets 11/04/2021 207  150 - 400 K/uL Final   nRBC 11/04/2021 0.0  0.0 - 0.2 % Final   Neutrophils Relative % 11/04/2021 77  % Final   Neutro Abs 11/04/2021 6.0  1.7 - 7.7 K/uL Final   Lymphocytes Relative 11/04/2021 13  % Final   Lymphs Abs 11/04/2021 1.0  0.7 - 4.0 K/uL Final   Monocytes Relative 11/04/2021 9  % Final   Monocytes Absolute 11/04/2021 0.7  0.1 - 1.0 K/uL Final   Eosinophils Relative 11/04/2021  1  % Final   Eosinophils Absolute 11/04/2021 0.1  0.0 - 0.5 K/uL Final   Basophils Relative 11/04/2021 0  % Final   Basophils Absolute 11/04/2021 0.0  0.0 - 0.1 K/uL Final   Immature Granulocytes 11/04/2021 0  % Final   Abs Immature Granulocytes 11/04/2021 0.03  0.00 - 0.07 K/uL Final   Performed at [facility], [address] [place], [place] [address]   Troponin I (High Sensitivity) 11/04/2021 8  <18 ng/L Final   Comment: (NOTE) Elevated high sensitivity troponin I (hsTnI) values and  significant  changes across serial measurements may suggest ACS but many other  chronic and acute conditions are known to elevate hsTnI results.  Refer to the "Links" section for chest pain algorithms and additional  guidance. Performed at [facility], [address] [place], [place] [address]    Opiates 11/04/2021 NONE DETECTED  NONE DETECTED Final   Cocaine 11/04/2021 POSITIVE (A)  NONE DETECTED Final   Benzodiazepines 11/04/2021 NONE DETECTED  NONE DETECTED Final   Amphetamines 11/04/2021 POSITIVE (A)  NONE DETECTED Final   Tetrahydrocannabinol 11/04/2021 POSITIVE (A)  NONE DETECTED Final   Barbiturates 11/04/2021 NONE DETECTED  NONE DETECTED Final   Comment: (NOTE) DRUG SCREEN FOR MEDICAL PURPOSES ONLY.  IF CONFIRMATION IS NEEDED FOR ANY PURPOSE, NOTIFY LAB WITHIN 5 DAYS.  LOWEST DETECTABLE LIMITS FOR URINE DRUG SCREEN Drug Class                     Cutoff (ng/mL) Amphetamine and metabolites    1000 Barbiturate and metabolites    200 Benzodiazepine                 200 Tricyclics and metabolites     300 Opiates and metabolites        300 Cocaine and metabolites        300 THC                            50 Performed at [facility], [address] [place], [place] [address]    Alcohol, Ethyl (B) 11/04/2021 <10  <10 mg/dL Final   Comment: (NOTE) Lowest detectable limit for serum alcohol is 10 mg/dL.  For medical purposes  only. Performed at [facility], [address] [place], [place] [address]     Blood Alcohol level:  Lab Results  Component Value Date   ETH <10 11/12/2021   ETH <10 11/04/2021    Metabolic Disorder Labs: Lab Results  Component Value Date   HGBA1C 5.5 11/12/2021   MPG 111.15 11/12/2021   No results found for: "PROLACTIN" Lab Results  Component Value Date   CHOL 147 11/12/2021   TRIG 81 11/12/2021   HDL 31 (L) 11/12/2021   CHOLHDL 4.7 11/12/2021   VLDL 16 11/12/2021   LDLCALC 100 (H) 11/12/2021   LDLCALC 95 07/13/2020    Therapeutic Lab Levels: No results found for: "LITHIUM" No results found for: "VALPROATE" No results found for: "CBMZ"  Physical Findings   GAD-7    Flowsheet Row Office Visit from 07/13/2020 in [facility] Office Visit from 07/10/2020 in [facility]  Total GAD-7 Score 11 18      PHQ2-9    Flowsheet Row ED from 11/11/2021 in [facility] Office Visit from 07/13/2020 in [facility] Office Visit from 07/10/2020 in [place]  PHQ-2 Total Score 2 3 2   PHQ-9 Total Score 6 7 10       Flowsheet Row ED from 11/11/2021 in [facility] ED from 11/10/2021 in [facility] ED from 11/04/2021 in [facility]  C-SSRS RISK CATEGORY No Risk No Risk No Risk       Psychiatric Specialty Exam  Presentation  General Appearance: Appropriate for Environment; Well Groomed   Eye Contact:Good   Speech:-- (not assessed)   Speech  Volume:-- (not assessed)   Handedness:-- (not assessed)    Mood and Affect  Mood:Euthymic   Affect:Appropriate; Congruent; Full Range    Thought Process  Thought Processes:Coherent; Linear; Goal Directed   Descriptions of Associations:Intact   Orientation:Full (Time, Place  and Person)   Thought Content:Logical; WDL   Diagnosis of Schizophrenia or Schizoaffective disorder in past: No     Hallucinations:Hallucinations: None   Ideas of Reference:None   Suicidal Thoughts:Suicidal Thoughts: No   Homicidal Thoughts:Homicidal Thoughts: No    Sensorium  Memory:Immediate Good; Recent Good; Remote Good   Judgment:Good   Insight:Good    Executive Functions  Concentration:Good   Attention Span:Good   Recall:Good   Fund of Knowledge:Good   Language:Good    Psychomotor Activity  Psychomotor Activity:Psychomotor Activity: Normal    Assets  Assets:Desire for Improvement; Resilience    Sleep  Sleep:Sleep: Good    Nutritional Assessment (For OBS and FBC admissions only) Has the patient had a weight loss or gain of 10 pounds or more in the last 3 months?: No Has the patient had a decrease in food intake/or appetite?: No Does the patient have dental problems?: No Does the patient have eating habits or behaviors that may be indicators of an eating disorder including binging or inducing vomiting?: No Has the patient recently lost weight without trying?: 0 Has the patient been eating poorly because of a decreased appetite?: 0 Malnutrition Screening Tool Score: 0     Physical Exam  Physical Exam Vitals and nursing note reviewed.  Constitutional:      Appearance: Normal appearance.  HENT:     Head: Normocephalic and atraumatic.  Pulmonary:     Effort: Pulmonary effort is normal.  Neurological:     General: No focal deficit present.     Mental Status: He is alert and oriented to person, place, and time.    Review of Systems  Constitutional: Negative.   Respiratory: Negative.    Cardiovascular: Negative.   Gastrointestinal: Negative.   Genitourinary: Negative.    Blood pressure (!) 94/57, pulse (!) 54, temperature 98.1 F (36.7 C), temperature source Temporal, resp. rate 15, SpO2 98 %. There is no height or weight  on file to calculate BMI.  Treatment Plan Summary: Daily contact with patient to assess and evaluate symptoms and progress in treatment, Medication management, and Plan Patient awaiting placement to a rehabilitation facility. Consider evaluating if meets criteria for FBC.  I discussed my assessment and planned treatment for the patient with Dr. Lucianne Muss who agrees with my formulated course of action.  Augusto Gamble, MD 11/12/2021 5:06 PM

## 2021-11-12 NOTE — Discharge Instructions (Signed)
Contact [facility] Help link you with  community resources that maybe limited due to disabilities.  You will be provided an advocate who will work with you to remove barriers. They can also assist with transportation, budgeting, disability rights, and cooking.  You can meet with them only by appointment:  [address] [place], [place] [address] [phone] [email or web address]   Mental Health Resources [facility]-will provide timely access to mental health services for children and adolescents (4-17) and adults presenting in a mental health crisis. The program is designed for those who need urgent Behavioral Health or Substance Use treatment and are not experiencing a medical crisis that would typically require an emergency room visit.    [address] [place], [place] [address] Phone: [phone] [email or web address]   [facility] will also offer the following outpatient services: (Monday through Friday 8am-5pm)   Partial Hospitalization Program (PHP) Substance Abuse Intensive Outpatient Program (SA-IOP) Group Therapy Medication Management Peer Living Room   We also provide (24/7):    Assessments: Our mental health clinician and providers will conduct a focused mental health evaluation, assessing for immediate safety concerns and further mental health needs.   Referral: Our team will provide resources and help connect to community based mental health treatment, when indicated, including psychotherapy, psychiatry, and other specialized behavioral health or substance use disorder services (for those not already in treatment).   Transitional Care: Our team providers in person bridging and/or telphonic follow-up during the patient's transition to outpatient services.    Other Mental Health Resources  [facility]: Deaf & Hard of Hearing  Address: [address] [place], [place]  Phone#: [phone]   [facility] Address: [address] [place], [place] [address] Phone: [phone] Services: Outpatient Therapy   [facility] Address: [address], [place], [place] [address] Phone: [phone] Services: Outpatient Therapy [facility] Address: [facility], [address], [place], [place] [address] Phone: [phone] Services: Outpatient Therapy  [facility] Address: [facility], [address] [place], [place] [address] Phone: [phone] Services: Outpatient, Medication management ,ACTT  [facility] Address: [address] [place], [place], [place] [address] Phone: [phone] Services: Outpatient Therapy, Substance use treatment, IOP, Medication management    [facility]  Address: [address], [place], [place] [address] Phone: [phone] Services: Outpatient Therapy  [facility] Address: [address] [place], [place] [address] Phone: [phone] Services: Outpatient Therapy  [facility] Address: [address] [place], [place] [address] Phone: [phone] Services: Outpatient Therapy, Substance Abuse Treatment  [facility] Address: [address] [place], [place] [address] Phone: [phone] Services: Outpatient Therapy  Substance Abuse Resources   [facility] - Admissions are currently completed Monday through Friday at 8am; both appointments and walk-ins are accepted.  Any individual that is a [facility] resident may present for a substance abuse screening and assessment for admission.  A person may be referred by numerous sources or self-refer.   Potential clients will be screened for medical necessity and appropriateness for the program.  Clients must meet criteria for high-intensity residential treatment services.  If clinically appropriate, a client will continue with the  comprehensive clinical assessment and intake process, as well as enrollment in the [facility].  Address: [address] [place], [place] [address] Admin Hours: Mon-Fri 8AM to [facility]  Hours: 24/7 Phone: [phone] Fax: [phone]  [facility] (Detox) Facility Based Crisis:  These are 3 locations for services: Please call before arrival:    [facility] Recovery Facility Based Crisis [facility])  Address: [address] [place], [place] [address] Phone: [phone]  [facility] Recovery Facility Based Crisis [facility]) Address: [address] [place], [place] [address] Phone#: [phone]  [facility] Recovery Facility Based Crisis [facility]) Address: [address] [place], [place] [address] Phone#: [phone]   [facility] ([facility]): (offers outpatient therapy and intensive outpatient substance abuse therapy).  [address], [place], [place] [address] Phone: [phone]  [facility] Address: [address] [place], [place] [address] Phone: [phone]  -The [facility] provides food, shelter and other programs and services to the homeless men of [place] through our [employer] program.  By offering safe shelter, three meals a day, clean clothing, Biblical counseling, financial planning, vocational training, GED/education and employment assistance, we've helped mend the shattered lives of many homeless men since opening in [place].  We have approximately 267 beds available, with a max of 312 beds including mats for emergency situations and currently house an average of 270 men a night.  Prospective Client Check-In Information Photo ID Required (State/ Out of State/ [facility]) - if photo ID is not available, clients are required to have a printout of a police/sheriff's criminal history report. Help out with chores around the [place]. No sex offender of any type (pending, charged, registered and/or any other sex related  offenses) will be permitted to check in. Must be willing to abide by all rules, regulations, and policies established by the [employer]. The following will be provided - shelter, food, clothing, and biblical counseling. If you or someone you know is in need of assistance at our [facility] shelter in [place], [place], please call [phone] ext. [phone].  [facility] for [employer] hours are Monday-Friday only.   [facility]:   Phone: [phone]  The [facility] SA Intensive Outpatient Program [facility]) means structured individual and group addiction activities and services that are provided at an outpatient program designed to assist adult and adolescent consumers to begin recovery and learn skills for recovery maintenance. The [employer] SAIOP program is offered at least 3 hours a day, 3 days a week. SAIOP services shall include a structured program consisting of, but not limited to, the following services: Individual counseling and support; Group counseling and support; Family counseling, training or support; Biochemical assays to identify recent drug use (e.g., urine drug screens); Strategies for relapse prevention to include community and social support systems in treatment; Life skills; Crisis contingency planning; Disease Management; and Treatment support activities that have been adapted or specifically designed for persons with physical disabilities, or persons with co-occurring disorders of mental illness and substance abuse/dependence or mental retardation/developmental disability and substance abuse/dependence.  Phone: [phone]   [facility])  Address: [address] [place], [place], [place] [address] Phone: [phone]  [facility] Address: [address] [place], [place] [address] Phone: [phone]  - a combination of group and individual sessions to meet the participants  needs. This allows participants to engage in treatment and remain involved in their home and work life. - Transitional housing places program participants in a supportive living environment while they complete a treatment program and work to secure independent housing. - The Substance Abuse Intensive Outpatient  Treatment Program at [employer] consists of structured group sessions and individual sessions that are designed to teach participants early recovery and relapse prevention skills. -[facility] works with the [employer] to provide a housing and treatment program for homeless veterans.   [facility] [employer], [employer].   Address: [address] [place], [place] [address] Phone#: [phone]   : Referrals to RTSA facilities can be made by [employer] and [facility].  Referrals are also accepted from physicians, private providers, hospital emergency rooms, family members, or any person who has knowledge of someone in the need of our services.  [facility] 24-Hour Call Center: [phone] Behavioral Health Crisis Line: [phone]

## 2021-11-12 NOTE — ED Notes (Signed)
Pt refused breakfast 

## 2021-11-13 ENCOUNTER — Encounter (HOSPITAL_COMMUNITY): Payer: Self-pay | Admitting: Emergency Medicine

## 2021-11-13 DIAGNOSIS — F1520 Other stimulant dependence, uncomplicated: Secondary | ICD-10-CM | POA: Diagnosis present

## 2021-11-13 LAB — CALCITRIOL (1,25 DI-OH VIT D): Vit D, 1,25-Dihydroxy: 57.3 pg/mL (ref 24.8–81.5)

## 2021-11-13 LAB — PARATHYROID HORMONE, INTACT (NO CA): PTH: 20 pg/mL (ref 15–65)

## 2021-11-13 NOTE — ED Notes (Signed)
Patient admitted to [facility] from [facility].  He is asking for detox.  Patient is deaf but can make needs known to staff.  No evidence of withdrawal.  Calm and cooperative with admission process.  Oriented to unit and room.  Will attempt to meet needs and provide safe environment for him.

## 2021-11-13 NOTE — Progress Notes (Signed)
Patient remains resting in bed without complaint or distress.  Makes needs known.  Will monitor. 

## 2021-11-13 NOTE — ED Provider Notes (Signed)
Facility Based Crisis Admission H&P  Date: 11/13/21 Patient Name: Carl Skinner MRN: [id number] Chief Complaint:  Chief Complaint  Patient presents with   Addiction Problem      Diagnoses:  Final diagnoses:  Methamphetamine use disorder, severe ([place])    HPI: Carl Skinner presented to [facility] behavioral health on 11/11/2021 seeking substance use treatment for home-going daily methamphetamine use.  Additionally he uses alcohol intermittently.  Most recent use of both methamphetamine and alcohol 3 days ago.  Patient denies substance use aside from methamphetamine and alcohol.  Urine drug screen on 11/12/2021 positive for marijuana.  Carl Skinner is reassessed, face-to-face, by nurse practitioner.  He is reclined in observation area upon my approach, appears asleep.  He is easily awakened.  Currently uses American sign language, ASL interpreter, Carl Skinner, interpreter ID [phone] assists with assessment.  He presents with euthymic mood, congruent affect.  Patient continues to deny suicidal and homicidal ideations.  He denies auditory and visual hallucinations.  There is no evidence of delusional thought content and no indication that patient is responding to internal stimuli.  He denies symptoms of paranoia.  He endorses average sleep and appetite while at [facility] behavioral health.  Carl Skinner is insightful today and plans to seek recovery.  He is committed to his sobriety.  He would prefer admission to residential substance use treatment if available.  He reports he has sought residential substance use treatment once in the distant past, many years ago.  Unable to recall facility.  He is not linked with outpatient psychiatry currently.  He reports personal mental health history of ADHD and depression.  He was most recently compliant with medications 3 weeks ago prior to admit to [facility] behavioral health.  He would like to follow-up with outpatient psychiatry moving  forward.  Patient offered support and encouragement.  He agrees with plan to transition to facility based crisis unit while awaiting potential residential substance use placement.   PHQ 2-9:  [place] ED from 11/11/2021 in [facility] Office Visit from 07/13/2020 in [place] Office Visit from 07/10/2020 in [facility]  Thoughts that you would be better off dead, or of hurting yourself in some way Not at all Not at all Not at all  PHQ-9 Total Score 6 7 10        [place] ED from 11/11/2021 in [facility] ED from 11/10/2021 in [facility] ED from 11/04/2021 in [place] DEPT  C-SSRS RISK CATEGORY No Risk No Risk No Risk        Total Time spent with patient: 30 minutes  Musculoskeletal  Strength & Muscle Tone: within normal limits Gait & Station: normal Patient leans: N/A  Psychiatric Specialty Exam  Presentation General Appearance: Appropriate for Environment; Casual  Eye Contact:Good  Speech:Other (comment) (non verbal- ASL)  Speech Volume:Other (comment) (ASL)  Handedness:Right   Mood and Affect  Mood:Euthymic  Affect:Appropriate; Congruent   Thought Process  Thought Processes:Coherent; Goal Directed; Linear  Descriptions of Associations:Intact  Orientation:Full (Time, Place and Person)  Thought Content:Logical; WDL  Diagnosis of Schizophrenia or Schizoaffective disorder in past: No   Hallucinations:Hallucinations: None  Ideas of Reference:None  Suicidal Thoughts:Suicidal Thoughts: No  Homicidal Thoughts:Homicidal Thoughts: No   Sensorium  Memory:Immediate Good; Recent Good  Judgment:Good  Insight:Good   Executive Functions  Concentration:Good  Attention Span:Good  [place] of Knowledge:Good  Language:Good   Psychomotor Activity  Psychomotor  Activity:Psychomotor Activity: Normal   Assets  Assets:Desire for Improvement; Financial Resources/Insurance; Intimacy; Leisure Time; Physical Health; Resilience   Sleep  Sleep:Sleep: Good   Nutritional Assessment (For OBS and FBC admissions only) Has the patient had a weight loss or gain of 10 pounds or more in the last 3 months?: No Has the patient had a decrease in food intake/or appetite?: No Does the patient have dental problems?: No Does the patient have eating habits or behaviors that may be indicators of an eating disorder including binging or inducing vomiting?: No Has the patient recently lost weight without trying?: 0 Has the patient been eating poorly because of a decreased appetite?: 0 Malnutrition Screening Tool Score: 0    Physical Exam Vitals and nursing note reviewed.  Constitutional:      Appearance: Normal appearance. He is well-developed and normal weight.  HENT:     Head: Normocephalic and atraumatic.     Nose: Nose normal.  Cardiovascular:     Rate and Rhythm: Normal rate.  Pulmonary:     Effort: Pulmonary effort is normal.  Musculoskeletal:        General: Normal range of motion.     Cervical back: Normal range of motion.  Skin:    General: Skin is warm and dry.  Neurological:     Mental Status: He is alert and oriented to person, place, and time.  Psychiatric:        Attention and Perception: Attention and perception normal.        Mood and Affect: Mood and affect normal.        Speech: Speech normal.        Behavior: Behavior normal. Behavior is cooperative.        Thought Content: Thought content normal.        Cognition and Memory: Cognition and memory normal.    Review of Systems  Constitutional: Negative.   HENT: Negative.    Eyes: Negative.   Respiratory: Negative.    Cardiovascular: Negative.   Gastrointestinal: Negative.   Genitourinary: Negative.   Musculoskeletal: Negative.   Skin: Negative.   Neurological: Negative.    Psychiatric/Behavioral:  Positive for substance abuse.     Blood pressure 122/87, pulse 62, temperature 98.7 F (37.1 C), temperature source Oral, resp. rate 18, SpO2 100 %. There is no height or weight on file to calculate BMI.  Past Psychiatric History: Anxiety, depression, polysubstance use disorder  Is the patient at risk to self? No  Has the patient been a risk to self in the past 6 months? No .    Has the patient been a risk to self within the distant past? No   Is the patient a risk to others? No   Has the patient been a risk to others in the past 6 months? No   Has the patient been a risk to others within the distant past? No   Past Medical History:  Past Medical History:  Diagnosis Date   Anxiety    Deaf    Depression    Substance abuse [facility])     Past Surgical History:  Procedure Laterality Date   BICEPT TENODESIS Left 06/20/2019   Procedure: BICEPS TENODESIS;  Surgeon: Hiram Gash, MD;  Location: [place];  Service: Orthopedics;  Laterality: Left;   NOSE SURGERY     ROTATOR CUFF REPAIR Left     Family History:  Family History  Problem Relation Age of Onset   Healthy Mother  Drug abuse Mother    Healthy Father    Drug abuse Sister    Drug abuse Maternal Uncle     Social History:  Social History   Socioeconomic History   Marital status: Unknown    Spouse name: Not on file   Number of children: Not on file   Years of education: Not on file   Highest education level: Not on file  Occupational History   Not on file  Tobacco Use   Smoking status: Every Day    Packs/day: 0.50    Types: Cigarettes   Smokeless tobacco: Never  Substance and Sexual Activity   Alcohol use: Not Currently   Drug use: Yes    Types: Methamphetamines    Comment: "currently high on meth" - states used this AM   Sexual activity: Not on file  Other Topics Concern   Not on file  Social History Narrative   Not on file   Social Determinants of Health    Financial Resource Strain: Not on file  Food Insecurity: Not on file  Transportation Needs: Not on file  Physical Activity: Not on file  Stress: Not on file  Social Connections: Not on file  Intimate Partner Violence: Not on file    SDOH:  SDOH Screenings   Alcohol Screen: Not on file  Depression (PHQ2-9): Medium Risk (11/11/2021)   Depression (PHQ2-9)    PHQ-2 Score: 6  Financial Resource Strain: Not on file  Food Insecurity: Not on file  Housing: Not on file  Physical Activity: Not on file  Social Connections: Not on file  Stress: Not on file  Tobacco Use: High Risk (11/13/2021)   Patient History    Smoking Tobacco Use: Every Day    Smokeless Tobacco Use: Never    Passive Exposure: Not on file  Transportation Needs: Not on file    Last Labs:  Admission on 11/11/2021  Component Date Value Ref Range Status   SARS Coronavirus 2 by RT PCR 11/11/2021 NEGATIVE  NEGATIVE Final   Comment: (NOTE) SARS-CoV-2 target nucleic acids are NOT DETECTED.  The SARS-CoV-2 RNA is generally detectable in upper respiratory specimens during the acute phase of infection. The lowest concentration of SARS-CoV-2 viral copies this assay can detect is 138 copies/mL. A negative result does not preclude SARS-Cov-2 infection and should not be used as the sole basis for treatment or other patient management decisions. A negative result may occur with  improper specimen collection/handling, submission of specimen other than nasopharyngeal swab, presence of viral mutation(s) within the areas targeted by this assay, and inadequate number of viral copies(<138 copies/mL). A negative result must be combined with clinical observations, patient history, and epidemiological information. The expected result is Negative.  Fact Sheet for Patients:  BloggerCourse.com  Fact Sheet for Healthcare Providers:  SeriousBroker.it  This test is no                           t yet approved or cleared by the [place] FDA and  has been authorized for detection and/or diagnosis of SARS-CoV-2 by FDA under an Emergency Use Authorization (EUA). This EUA will remain  in effect (meaning this test can be used) for the duration of the COVID-19 declaration under Section 564(b)(1) of the Act, 21 U.S.C.section 360bbb-3(b)(1), unless the authorization is terminated  or revoked sooner.       Influenza A by PCR 11/11/2021 NEGATIVE  NEGATIVE Final   Influenza B by PCR  11/11/2021 NEGATIVE  NEGATIVE Final   Comment: (NOTE) The Xpert Xpress SARS-CoV-2/FLU/RSV plus assay is intended as an aid in the diagnosis of influenza from Nasopharyngeal swab specimens and should not be used as a sole basis for treatment. Nasal washings and aspirates are unacceptable for Xpert Xpress SARS-CoV-2/FLU/RSV testing.  Fact Sheet for Patients: EntrepreneurPulse.com.au  Fact Sheet for Healthcare Providers: IncredibleEmployment.be  This test is not yet approved or cleared by the [place] FDA and has been authorized for detection and/or diagnosis of SARS-CoV-2 by FDA under an Emergency Use Authorization (EUA). This EUA will remain in effect (meaning this test can be used) for the duration of the COVID-19 declaration under Section 564(b)(1) of the Act, 21 U.S.C. section 360bbb-3(b)(1), unless the authorization is terminated or revoked.  Performed at [facility], [place] [address], [place], [place] [address]    WBC 11/12/2021 5.1  4.0 - 10.5 K/uL Final   RBC 11/12/2021 4.92  4.22 - 5.81 MIL/uL Final   Hemoglobin 11/12/2021 14.4  13.0 - 17.0 g/dL Final   HCT 11/12/2021 43.0  39.0 - 52.0 % Final   MCV 11/12/2021 87.4  80.0 - 100.0 fL Final   MCH 11/12/2021 29.3  26.0 - 34.0 pg Final   MCHC 11/12/2021 33.5  30.0 - 36.0 g/dL Final   RDW 11/12/2021 13.0  11.5 - 15.5 % Final   Platelets 11/12/2021 307  150 - 400 K/uL Final   nRBC  11/12/2021 0.0  0.0 - 0.2 % Final   Neutrophils Relative % 11/12/2021 47  % Final   Neutro Abs 11/12/2021 2.4  1.7 - 7.7 K/uL Final   Lymphocytes Relative 11/12/2021 42  % Final   Lymphs Abs 11/12/2021 2.1  0.7 - 4.0 K/uL Final   Monocytes Relative 11/12/2021 8  % Final   Monocytes Absolute 11/12/2021 0.4  0.1 - 1.0 K/uL Final   Eosinophils Relative 11/12/2021 2  % Final   Eosinophils Absolute 11/12/2021 0.1  0.0 - 0.5 K/uL Final   Basophils Relative 11/12/2021 1  % Final   Basophils Absolute 11/12/2021 0.0  0.0 - 0.1 K/uL Final   Immature Granulocytes 11/12/2021 0  % Final   Abs Immature Granulocytes 11/12/2021 0.01  0.00 - 0.07 K/uL Final   Performed at [facility], [place] [address] [place], [place] [address]   Sodium 11/12/2021 138  135 - 145 mmol/L Final   Potassium 11/12/2021 3.1 (L)  3.5 - 5.1 mmol/L Final   Chloride 11/12/2021 103  98 - 111 mmol/L Final   CO2 11/12/2021 27  22 - 32 mmol/L Final   Glucose, Bld 11/12/2021 122 (H)  70 - 99 mg/dL Final   Glucose reference range applies only to samples taken after fasting for at least 8 hours.   BUN 11/12/2021 14  6 - 20 mg/dL Final   Creatinine, Ser 11/12/2021 0.91  0.61 - 1.24 mg/dL Final   Calcium 11/12/2021 8.6 (L)  8.9 - 10.3 mg/dL Final   Total Protein 11/12/2021 6.2 (L)  6.5 - 8.1 g/dL Final   Albumin 11/12/2021 2.9 (L)  3.5 - 5.0 g/dL Final   AST 11/12/2021 18  15 - 41 U/L Final   ALT 11/12/2021 24  0 - 44 U/L Final   Alkaline Phosphatase 11/12/2021 57  38 - 126 U/L Final   Total Bilirubin 11/12/2021 0.8  0.3 - 1.2 mg/dL Final   GFR, Estimated 11/12/2021 >60  >60 mL/min Final   Comment: (NOTE) Calculated using the CKD-EPI  Creatinine Equation (2021)    Anion gap 11/12/2021 8  5 - 15 Final   Performed at [facility], [place] [address] [place], [place] [address]   Hgb A1c MFr Bld 11/12/2021 5.5  4.8 - 5.6 % Final   Comment: (NOTE) Pre diabetes:          5.7%-6.4%  Diabetes:              >6.4%  Glycemic  control for   <7.0% adults with diabetes    Mean Plasma Glucose 11/12/2021 111.15  mg/dL Final   Performed at [facility], [place] [address] [place], [place] [address]   Alcohol, Ethyl (B) 11/12/2021 <10  <10 mg/dL Final   Comment: (NOTE) Lowest detectable limit for serum alcohol is 10 mg/dL.  For medical purposes only. Performed at [facility], [place] [address], [place], [place] [address]    TSH 11/12/2021 0.158 (L)  0.350 - 4.500 uIU/mL Final   Comment: Performed by a 3rd Generation assay with a functional sensitivity of <=0.01 uIU/mL. Performed at [facility], [place] [address], [place], [place] [address]    POC Amphetamine UR 11/12/2021 None Detected  NONE DETECTED (Cut Off Level 1000 ng/mL) Final   POC Secobarbital (BAR) 11/12/2021 None Detected  NONE DETECTED (Cut Off Level 300 ng/mL) Final   POC Buprenorphine (BUP) 11/12/2021 None Detected  NONE DETECTED (Cut Off Level 10 ng/mL) Final   POC Oxazepam (BZO) 11/12/2021 None Detected  NONE DETECTED (Cut Off Level 300 ng/mL) Final   POC Cocaine UR 11/12/2021 None Detected  NONE DETECTED (Cut Off Level 300 ng/mL) Final   POC Methamphetamine UR 11/12/2021 None Detected  NONE DETECTED (Cut Off Level 1000 ng/mL) Final   POC Morphine 11/12/2021 None Detected  NONE DETECTED (Cut Off Level 300 ng/mL) Final   POC Methadone UR 11/12/2021 None Detected  NONE DETECTED (Cut Off Level 300 ng/mL) Final   POC Oxycodone UR 11/12/2021 None Detected  NONE DETECTED (Cut Off Level 100 ng/mL) Final   POC Marijuana UR 11/12/2021 Positive (A)  NONE DETECTED (Cut Off Level 50 ng/mL) Final   SARSCOV2ONAVIRUS 2 AG 11/11/2021 NEGATIVE  NEGATIVE Final   Comment: (NOTE) SARS-CoV-2 antigen NOT DETECTED.   Negative results are presumptive.  Negative results do not preclude SARS-CoV-2 infection and should not be used as the sole basis for treatment or other patient management decisions, including infection  control decisions, particularly in the  presence of clinical signs and  symptoms consistent with COVID-19, or in those who have been in contact with the virus.  Negative results must be combined with clinical observations, patient history, and epidemiological information. The expected result is Negative.  Fact Sheet for Patients: HandmadeRecipes.com.cy  Fact Sheet for Healthcare Providers: FuneralLife.at  This test is not yet approved or cleared by the [place] FDA and  has been authorized for detection and/or diagnosis of SARS-CoV-2 by FDA under an Emergency Use Authorization (EUA).  This EUA will remain in effect (meaning this test can be used) for the duration of  the COV                          ID-19 declaration under Section 564(b)(1) of the Act, 21 U.S.C. section 360bbb-3(b)(1), unless the authorization is terminated or revoked sooner.     Cholesterol 11/12/2021 147  0 - 200 mg/dL Final   Triglycerides 11/12/2021 81  <150 mg/dL Final   HDL  11/12/2021 31 (L)  >40 mg/dL Final   Total CHOL/HDL Ratio 11/12/2021 4.7  RATIO Final   VLDL 11/12/2021 16  0 - 40 mg/dL Final   LDL Cholesterol 11/12/2021 100 (H)  0 - 99 mg/dL Final   Comment:        Total Cholesterol/HDL:CHD Risk Coronary Heart Disease Risk Table                     Men   Women  1/2 Average Risk   3.4   3.3  Average Risk       5.0   4.4  2 X Average Risk   9.6   7.1  3 X Average Risk  23.4   11.0        Use the calculated Patient Ratio above and the CHD Risk Table to determine the patient's CHD Risk.        ATP III CLASSIFICATION (LDL):  <100     mg/dL   Optimal  100-129  mg/dL   Near or Above                    Optimal  130-159  mg/dL   Borderline  160-189  mg/dL   High  >190     mg/dL   Very High Performed at [place] [address] [place], [place] [address]    Phosphorus 11/12/2021 2.6  2.5 - 4.6 mg/dL Final   Performed at [place] [address], [place], [place]  [address]   Free T4 11/12/2021 1.20 (H)  0.61 - 1.12 ng/dL Final   Comment: (NOTE) Biotin ingestion may interfere with free T4 tests. If the results are inconsistent with the TSH level, previous test results, or the clinical presentation, then consider biotin interference. If needed, order repeat testing after stopping biotin. Performed at [facility], [place] [address] [place], [place] [address]    Magnesium 11/12/2021 2.2  1.7 - 2.4 mg/dL Final   Performed at [place] [address] [place], [place] [address]   PTH 11/12/2021 20  15 - 65 pg/mL Final   Comment: (NOTE) Performed At: [facility] [place], [place] [id number] Rush Farmer MD [id number]   Admission on 11/04/2021, Discharged on 11/04/2021  Component Date Value Ref Range Status   Sodium 11/04/2021 140  135 - 145 mmol/L Final   Potassium 11/04/2021 3.0 (L)  3.5 - 5.1 mmol/L Final   Chloride 11/04/2021 103  98 - 111 mmol/L Final   CO2 11/04/2021 29  22 - 32 mmol/L Final   Glucose, Bld 11/04/2021 136 (H)  70 - 99 mg/dL Final   Glucose reference range applies only to samples taken after fasting for at least 8 hours.   BUN 11/04/2021 21 (H)  6 - 20 mg/dL Final   Creatinine, Ser 11/04/2021 1.00  0.61 - 1.24 mg/dL Final   Calcium 11/04/2021 8.3 (L)  8.9 - 10.3 mg/dL Final   GFR, Estimated 11/04/2021 >60  >60 mL/min Final   Comment: (NOTE) Calculated using the CKD-EPI Creatinine Equation (2021)    Anion gap 11/04/2021 8  5 - 15 Final   Performed at [facility], [place] [address], [place], [place] [address]   WBC 11/04/2021 7.8  4.0 - 10.5 K/uL Final   RBC 11/04/2021 4.91  4.22 - 5.81 MIL/uL Final   Hemoglobin 11/04/2021 14.6  13.0 - 17.0 g/dL Final   HCT  11/04/2021 44.2  39.0 - 52.0 % Final   MCV 11/04/2021 90.0  80.0 - 100.0 fL Final   MCH 11/04/2021 29.7  26.0 - 34.0 pg Final   MCHC 11/04/2021 33.0  30.0 - 36.0 g/dL Final   RDW 17/91/5056 13.5  11.5 - 15.5 %  Final   Platelets 11/04/2021 207  150 - 400 K/uL Final   nRBC 11/04/2021 0.0  0.0 - 0.2 % Final   Neutrophils Relative % 11/04/2021 77  % Final   Neutro Abs 11/04/2021 6.0  1.7 - 7.7 K/uL Final   Lymphocytes Relative 11/04/2021 13  % Final   Lymphs Abs 11/04/2021 1.0  0.7 - 4.0 K/uL Final   Monocytes Relative 11/04/2021 9  % Final   Monocytes Absolute 11/04/2021 0.7  0.1 - 1.0 K/uL Final   Eosinophils Relative 11/04/2021 1  % Final   Eosinophils Absolute 11/04/2021 0.1  0.0 - 0.5 K/uL Final   Basophils Relative 11/04/2021 0  % Final   Basophils Absolute 11/04/2021 0.0  0.0 - 0.1 K/uL Final   Immature Granulocytes 11/04/2021 0  % Final   Abs Immature Granulocytes 11/04/2021 0.03  0.00 - 0.07 K/uL Final   Performed at [facility], [address], [place], [place] [address]   Troponin I (High Sensitivity) 11/04/2021 8  <18 ng/L Final   Comment: (NOTE) Elevated high sensitivity troponin I (hsTnI) values and significant  changes across serial measurements may suggest ACS but many other  chronic and acute conditions are known to elevate hsTnI results.  Refer to the "Links" section for chest pain algorithms and additional  guidance. Performed at [facility], [address] [place], [place] [address]    Opiates 11/04/2021 NONE DETECTED  NONE DETECTED Final   Cocaine 11/04/2021 POSITIVE (A)  NONE DETECTED Final   Benzodiazepines 11/04/2021 NONE DETECTED  NONE DETECTED Final   Amphetamines 11/04/2021 POSITIVE (A)  NONE DETECTED Final   Tetrahydrocannabinol 11/04/2021 POSITIVE (A)  NONE DETECTED Final   Barbiturates 11/04/2021 NONE DETECTED  NONE DETECTED Final   Comment: (NOTE) DRUG SCREEN FOR MEDICAL PURPOSES ONLY.  IF CONFIRMATION IS NEEDED FOR ANY PURPOSE, NOTIFY LAB WITHIN 5 DAYS.  LOWEST DETECTABLE LIMITS FOR URINE DRUG SCREEN Drug Class                     Cutoff (ng/mL) Amphetamine and metabolites    1000 Barbiturate and metabolites     200 Benzodiazepine                 200 Tricyclics and metabolites     300 Opiates and metabolites        300 Cocaine and metabolites        300 THC                            50 Performed at [facility], [address], [place], [place] [address]    Alcohol, Ethyl (B) 11/04/2021 <10  <10 mg/dL Final   Comment: (NOTE) Lowest detectable limit for serum alcohol is 10 mg/dL.  For medical purposes only. Performed at [facility], [address] [place], [place] [address]     Allergies: Shrimp extract allergy skin test  PTA Medications: (Not in a hospital admission)   Long Term Goals: Improvement in symptoms so as ready for discharge  Short Term Goals: Patient will verbalize feelings in meetings with treatment team  members., Patient will attend at least of 50% of the groups daily., Pt will complete the PHQ9 on admission, day 3 and discharge., Patient will participate in completing the [place] Suicide Severity Rating Scale, Patient will score a low risk of violence for 24 hours prior to discharge, and Patient will take medications as prescribed daily.  Medical Decision Making  Patient reviewed with Dr. Nelly Rout.  He remains voluntary and will be admitted to facility based crisis unit for treatment and stabilization while awaiting residential substance use treatment.  Current medications include:  -Acetaminophen 650 mg every 6 as needed/mild pain -Maalox 30 mL oral every 4 as needed/digestion -Doxepin 10 mg nightly as needed/sleep -Hydroxyzine 25 mg 3 times daily as needed/anxiety -Magnesium hydroxide 30 mL daily as needed/mild constipation -Nicotine 14 mg transdermal patch daily -Ramelteon 8 mg nightly as needed/difficulty maintaining sleep -Trazodone 50 mg nightly as needed/sleep  CIWA Ativan protocol continued including: -Loperamide 2 to 4 mg oral as needed/diarrhea or loose stools -Lorazepam 1 mg every 6 hours as needed CIWA  greater than 10 -Multivitamin with minerals 1 tablet daily -Ondansetron disintegrating tablet 4 mg every 6 as needed/nausea or vomiting -Thiamine tablet 100 mg daily  Agitation protocol continued including: -Olanzapine Zydis 5 mg every 8 hours as needed/agitation -Lorazepam 1 mg as needed as needed/anxiety or severe agitation for 1 dose -Ziprasidone 20 mg IM as needed/agitation for 1 dose   Recommendations  Based on my evaluation the patient does not appear to have an emergency medical condition.  Lenard Lance, FNP 11/13/21  12:04 PM

## 2021-11-13 NOTE — ED Notes (Signed)
Pt is awake and lying in bed eating breakfast, ,no distress noted

## 2021-11-13 NOTE — ED Notes (Signed)
Pt is currently sleeping, no distress noted, will continue to monitor patient for safety 

## 2021-11-13 NOTE — ED Notes (Signed)
Breakfast was given 

## 2021-11-13 NOTE — Progress Notes (Signed)
CSW provided the following resources for the patient to utilize for treatment:   Substance Abuse Resources   [facility] - Admissions are currently completed Monday through Friday at 8am; both appointments and walk-ins are accepted.  Any individual that is a [facility] resident may present for a substance abuse screening and assessment for admission.  A person may be referred by numerous sources or self-refer.   Potential clients will be screened for medical necessity and appropriateness for the program.  Clients must meet criteria for high-intensity residential treatment services.  If clinically appropriate, a client will continue with the comprehensive clinical assessment and intake process, as well as enrollment in the [facility].  Address: [address] [place], [place] [address] Admin Hours: Mon-Fri 8AM to [facility] Hours: 24/7 Phone: [phone] Fax: [phone]  [facility] Facility Based Crisis:  These are 3 locations for services for week and weekend: Please call before arrival:    [facility] Recovery Facility Based Crisis [facility])  Address: [address] [place], [place] [address] Phone: [phone]  [facility] Facility Based Crisis [facility]) Address: [address] [place], [place] [address] Phone#: [phone]  [facility] Recovery Facility Based Crisis [facility]) Address: [address] [place], [place] [address] Phone#: [phone]   [facility]): (offers outpatient therapy and intensive outpatient substance abuse therapy).  [address] [place], [place] [address] Phone: [phone]   Phone: [phone]  [facility] SA Intensive Outpatient Program [facility]) means structured individual and group addiction activities and services that are provided at an outpatient program designed to assist adult and adolescent consumers to begin recovery and learn skills for  recovery maintenance. The [employer] SAIOP program is offered at least 3 hours a day, 3 days a week. SAIOP services shall include a structured program consisting of, but not limited to, the following services: Individual counseling and support; Group counseling and support; Family counseling, training or support; Biochemical assays to identify recent drug use (e.g., urine drug screens); Strategies for relapse prevention to include community and social support systems in treatment; Life skills; Crisis contingency planning; Disease Management; and Treatment support activities that have been adapted or specifically designed for persons with physical disabilities, or persons with co-occurring disorders of mental illness and substance abuse/dependence or mental retardation/developmental disability and substance abuse/dependence.  Phone: [phone]   [facility])  Address: [address] [place], [place], [place] [address] Phone: [phone]   [facility] Address: [address], [place], [place] [address] Phone: [phone]  - a combination of group and individual sessions to meet the participants needs. This allows participants to engage in treatment and remain involved in their home and work life. - Transitional housing places program participants in a supportive living environment while they complete a treatment program and work to secure independent housing. - The Substance Abuse Intensive Outpatient Treatment Program at [employer] consists of structured group sessions and individual sessions that are designed to teach participants early recovery and relapse prevention skills. -[facility] works with the [employer] to provide a housing and treatment program for homeless veterans.   [employer].   Address: [address] [place], [place] [address] Phone#: [phone]   : Referrals to RTSA facilities can  be made by [employer] and [facility].  Referrals are also accepted from physicians, private providers, hospital emergency rooms, family members, or  any person who has knowledge of someone in the need of our services.  [facility] will also offer the following outpatient services: (Monday through Friday 8am-5pm)   Partial Hospitalization Program (PHP) Substance Abuse Intensive Outpatient Program (SA-IOP) Group Therapy Medication Management Peer Living Room We also provide (24/7):  Assessments: Our mental health clinician and providers will conduct a focused mental health evaluation, assessing for immediate safety concerns and further mental health needs. Referral: Our team will provide resources and help connect to community based mental health treatment, when indicated, including psychotherapy, psychiatry, and other specialized behavioral health or substance use disorder services (for those not already in treatment). Transitional Care: Our team providers in person bridging and/or telephonic follow-up during the patient's transition to outpatient services.   The [facility] 24-Hour Call Center: [phone] Behavioral Health Crisis Line: [phone]

## 2021-11-13 NOTE — ED Notes (Signed)
Pt sleeping at present, no distress noted. Respirations even & unlabored.  Monitoring for safety. 

## 2021-11-13 NOTE — ED Notes (Addendum)
Pt arrived [facility] 

## 2021-11-13 NOTE — Progress Notes (Signed)
CSW provided the following resources for the patient to utilize:    Mental Health Resources [facility]-will provide timely access to mental health services for children and adolescents (4-17) and adults presenting in a mental health crisis. The program is designed for those who need urgent Behavioral Health or Substance Use treatment and are not experiencing a medical crisis that would typically require an emergency room visit.    [address] [place], [place] [address] Phone: [phone] [email or web address]   [facility] will also offer the following outpatient services: (Monday through Friday 8am-5pm)   Partial Hospitalization Program (PHP) Substance Abuse Intensive Outpatient Program (SA-IOP) Group Therapy Medication Management Peer Living Room   We also provide (24/7):    Assessments: Our mental health clinician and providers will conduct a focused mental health evaluation, assessing for immediate safety concerns and further mental health needs.   Referral: Our team will provide resources and help connect to community based mental health treatment, when indicated, including psychotherapy, psychiatry, and other specialized behavioral health or substance use disorder services (for those not already in treatment).   Transitional Care: Our team providers in person bridging and/or telphonic follow-up during the patient's transition to outpatient services.    Other Mental Health Resources  [facility]  Address: [address] [place], [place]  Phone#: [phone]   Mental [facility] Address: [address] [place], [place] [address] Phone: [phone] Services: Outpatient Therapy   [facility] Address: [address], [place], [place] [address] Phone: [phone] Services: Outpatient Therapy [facility] Address: [facility], [address], [place],  [place] [address] Phone: [phone] Services: Outpatient Therapy  [facility] Address: [facility], [address], [place], [place] [address] Phone: [phone] Services: Outpatient, Medication management ,[facility] Address: [address] [place], [place], [place] [address] Phone: [phone] Services: Outpatient Therapy, Substance use treatment, IOP, Medication management    [facility]  Address: [address], [place], [place] [address] Phone: [phone] Services: Outpatient Therapy  [facility], [facility] Address: [address], [place], [place] [address] Phone: [phone] Services: Outpatient Therapy  [facility] Address: [address], [place], [place] [address] Phone: [phone] Services: Outpatient Therapy, [facility] Address: [address], [place], [place] [address] Phone: [phone] Services: Outpatient Therapy  Mental Health Resources [facility]-will provide timely access to mental health services for children and adolescents (4-17) and adults presenting in a mental health crisis. The program is designed for those who need urgent Behavioral Health or Substance Use treatment and are not experiencing a medical crisis that would typically require an emergency room visit.    [address] [place], [place] [address] Phone: [phone] [email or web address]   The [facility] will also offer the following outpatient services: (Monday through Friday 8am-5pm)   Partial Hospitalization Program (PHP) Substance Abuse Intensive Outpatient Program (SA-IOP) Group Therapy Medication Management Peer Living Room   We also provide (24/7):    Assessments: Our mental health clinician and providers will conduct a focused mental health evaluation, assessing for immediate safety concerns and further mental health needs.    Referral: Our team will provide resources and help connect to community based mental health treatment, when indicated, including psychotherapy, psychiatry, and other specialized behavioral health or substance use disorder services (for those not already in  treatment).   Transitional Care: Our team providers in person bridging and/or telphonic follow-up during the patient's transition to outpatient services.    Other Mental Health Resources  [facility] Hard of [facility]  Address: [address] [place], [place]  Phone#: [phone]   Mental [facility] Address: [address], [place], [place] [address] Phone: [phone] Services: Outpatient Therapy   [facility] Address: [address], [place], [place] [address] Phone: [phone] Services: Outpatient Therapy [facility] Address: [facility], [address], [place], [place] [address] Phone: [phone] Services: Outpatient Therapy  [facility] Address: [facility], [address] [place], [place] [address] Phone: [phone] Services: Outpatient, Medication management ,[facility] Address: [address] [place], [place], [place] [address] Phone: [phone] Services: Outpatient Therapy, Substance use treatment, IOP, Medication management    [facility]  Address: [address], [place], [place] [address] Phone: [phone] Services: Outpatient Therapy  [facility], [facility] Address: [address], [place], [place] [address] Phone: [phone] Services: Outpatient Therapy  [facility] Address: [address], [place], [place] [address] Phone: [phone] Services: Outpatient Therapy, [facility] Address: [address] [place], [place] [address] Phone: [phone] Services: Outpatient Therapy  Crissie Reese, MSW, Lenice Pressman Phone:  [phone] Disposition/TOC

## 2021-11-13 NOTE — ED Notes (Signed)
Pt A&O x 4, had a snack, no distress noted.  Calm & cooperative, monitoring for safety.

## 2021-11-13 NOTE — ED Notes (Signed)
Pt asleep in bed. Respirations even and unlabored. Will continue to monitor for safety. ?

## 2021-11-14 NOTE — ED Notes (Signed)
Pt sleeping at present, no distress noted.  Monitoring for safety. 

## 2021-11-14 NOTE — ED Notes (Signed)
Pt resting in bed. A&O x4, calm and cooperative. Denies current SI/HI/AVH. No signs of acute distress noted. Will continue to monitor for safety.  

## 2021-11-14 NOTE — ED Notes (Signed)
Patient isolative to room and bed.  He is able to make needs known to staff despite HOH.  He is calm and pleasant.  Will monitor.

## 2021-11-14 NOTE — Progress Notes (Signed)
Offered to take patient into courtyard and he declined.

## 2021-11-14 NOTE — Progress Notes (Signed)
Patient got out of bed to eat lunch.  He is in day room at this time.

## 2021-11-14 NOTE — ED Provider Notes (Signed)
Behavioral Health Progress Note  Date and Time: 11/14/2021 9:49 AM Name: Carl Skinner MRN:  [id number]  Subjective:  Carl Skinner presented to [facility] behavioral health on 11/11/2021 seeking substance use treatment for home-going daily methamphetamine use.  He also reports using alcohol intermittently. Most recent use of both methamphetamine and alcohol 3 days ago. Urine drug screen on 11/12/2021 positive for marijuana.  Patient seen and evaluated face-to-face by this provider using an ASL interpreter [phone], and chart reviewed. On evaluation, patient is alert and oriented x 4. His thought process is logical and goal oriented. His mood is depressed and affect is congruent. He has fair eye contact. Today, patient denies suicidal and homicidal ideations. He denies auditory and visual hallucinations. There is no objective evidence that the patient is currently responding to internal or external stimuli. He reports feeling depressed "a little bit."  When asked to describe his depressive symptoms, he states, "I do not know." When asked if he is experiencing feelings of sadness, or hopelessness, he states, "I do not know." He reports fair sleep. He reports a fair appetite. He denies medication side effects at this time, no nausea, vomiting, loose stools, or headaches. He denies physical complaints at this time. He was allowed time for questions or concerns, he denies any questions or concerns at this time. He states that his discharge plan is for him to go to residential treatment. He states that he is waiting to speak with the social worker on Monday.   Diagnosis:  Final diagnoses:  Methamphetamine use disorder, severe ([place])    Total Time spent with patient: 30 minutes  Past Psychiatric History: History of anxiety, depression, and polysubstance use disorder. Past Medical History:  Past Medical History:  Diagnosis Date   Anxiety    Deaf    Depression    Substance abuse [facility])     Past  Surgical History:  Procedure Laterality Date   BICEPT TENODESIS Left 06/20/2019   Procedure: BICEPS TENODESIS;  Surgeon: Hiram Gash, MD;  Location: [facility];  Service: Orthopedics;  Laterality: Left;   NOSE SURGERY     ROTATOR CUFF REPAIR Left    Family History:  Family History  Problem Relation Age of Onset   Healthy Mother    Drug abuse Mother    Healthy Father    Drug abuse Sister    Drug abuse Maternal Uncle    Family Psychiatric  History: Mother history of drug abuse, sister history of drug abuse and maternal uncle history of drug abuse. Social History:  Social History   Substance and Sexual Activity  Alcohol Use Not Currently     Social History   Substance and Sexual Activity  Drug Use Yes   Types: Methamphetamines   Comment: "currently high on meth" - states used this AM    Social History   Socioeconomic History   Marital status: Unknown    Spouse name: Not on file   Number of children: Not on file   Years of education: Not on file   Highest education level: Not on file  Occupational History   Not on file  Tobacco Use   Smoking status: Every Day    Packs/day: 0.50    Types: Cigarettes   Smokeless tobacco: Never  Substance and Sexual Activity   Alcohol use: Not Currently   Drug use: Yes    Types: Methamphetamines    Comment: "currently high on meth" - states used this AM   Sexual activity: Not on  file  Other Topics Concern   Not on file  Social History Narrative   Not on file   Social Determinants of Health   Financial Resource Strain: Not on file  Food Insecurity: Not on file  Transportation Needs: Not on file  Physical Activity: Not on file  Stress: Not on file  Social Connections: Not on file   SDOH:  SDOH Screenings   Alcohol Screen: Not on file  Depression (PHQ2-9): Low Risk  (11/13/2021)   Depression (PHQ2-9)    PHQ-2 Score: 1  Recent Concern: Depression (PHQ2-9) - Medium Risk (11/11/2021)   Depression (PHQ2-9)     PHQ-2 Score: 6  Financial Resource Strain: Not on file  Food Insecurity: Not on file  Housing: Not on file  Physical Activity: Not on file  Social Connections: Not on file  Stress: Not on file  Tobacco Use: High Risk (11/13/2021)   Patient History    Smoking Tobacco Use: Every Day    Smokeless Tobacco Use: Never    Passive Exposure: Not on file  Transportation Needs: Not on file   Additional Social History:    Pain Medications: See MAR Prescriptions: See MAR Over the Counter: See MAR History of alcohol / drug use?: Yes Longest period of sobriety (when/how long): 2 years - "many years ago?" unclear Negative Consequences of Use: Financial Withdrawal Symptoms: None Name of Substance 1: Meth 1 - Age of First Use: 21 1 - Amount (size/oz): $20 1 - Frequency: daily 1 - Duration: "30 years" - outside of 2 yr clean "years ago' 1 - Last Use / Amount: yesterday 1 - Method of Aquiring: NA 1- Route of Use: smokes Name of Substance 2: crack cocaine 2 - Age of First Use: 21 2 - Amount (size/oz): varies 2 - Frequency: 2-3 x/wk 2 - Duration: 2 wks - use in past 2 - Last Use / Amount: yesterday 2 - Method of Aquiring: NA 2 - Route of Substance Use: NA Name of Substance 3: THC 3 - Age of First Use: 21 3 - Amount (size/oz): 2-3 blunts 3 - Frequency: daily 3 - Duration: "years" 3 - Last Use / Amount: today 3 - Method of Aquiring: NA 3 - Route of Substance Use: smokes  Current Medications:  Current Facility-Administered Medications  Medication Dose Route Frequency Provider Last Rate Last Admin   acetaminophen (TYLENOL) tablet 650 mg  650 mg Oral Q6H PRN Augusto Gamble, MD       alum & mag hydroxide-simeth (MAALOX/MYLANTA) 200-200-20 MG/5ML suspension 30 mL  30 mL Oral Q4H PRN Augusto Gamble, MD       cycloSPORINE (RESTASIS) 0.05 % ophthalmic emulsion 1 drop  1 drop Both Eyes QHS Lamptey, Britta Mccreedy, MD       doxepin (SINEQUAN) capsule 10 mg  10 mg Oral QHS PRN Augusto Gamble, MD        hydrOXYzine (ATARAX) tablet 25 mg  25 mg Oral Q6H PRN Augusto Gamble, MD       loperamide (IMODIUM) capsule 2-4 mg  2-4 mg Oral PRN Augusto Gamble, MD       LORazepam (ATIVAN) tablet 1 mg  1 mg Oral Q6H PRN Augusto Gamble, MD       OLANZapine zydis (ZYPREXA) disintegrating tablet 5 mg  5 mg Oral Q8H PRN Augusto Gamble, MD       And   LORazepam (ATIVAN) tablet 1 mg  1 mg Oral PRN Augusto Gamble, MD       And   ziprasidone (  GEODON) injection 20 mg  20 mg Intramuscular PRN Camelia Phenes, MD       magnesium hydroxide (MILK OF MAGNESIA) suspension 30 mL  30 mL Oral Daily PRN Camelia Phenes, MD       multivitamin with minerals tablet 1 tablet  1 tablet Oral Daily Camelia Phenes, MD   1 tablet at 11/12/21 0948   nicotine (NICODERM CQ - dosed in mg/24 hours) patch 14 mg  14 mg Transdermal Q0600 Camelia Phenes, MD   14 mg at 11/12/21 0948   ondansetron (ZOFRAN-ODT) disintegrating tablet 4 mg  4 mg Oral Q6H PRN Camelia Phenes, MD       ramelteon (ROZEREM) tablet 8 mg  8 mg Oral QHS PRN Camelia Phenes, MD       thiamine (VITAMIN B1) tablet 100 mg  100 mg Oral Daily Camelia Phenes, MD   100 mg at 11/12/21 [id number]   Current Outpatient Medications  Medication Sig Dispense Refill   atomoxetine (STRATTERA) 40 MG capsule Take 40 mg by mouth daily.     cyanocobalamin 1000 MCG tablet Take 1,000 mcg by mouth daily.     DULoxetine (CYMBALTA) 30 MG capsule Take 30 mg by mouth daily.     ibuprofen (ADVIL) 600 MG tablet Take 1 tablet (600 mg total) by mouth every 6 (six) hours as needed. (Patient taking differently: Take 600 mg by mouth every 6 (six) hours as needed for mild pain.) 30 tablet 0   LORazepam (ATIVAN) 1 MG tablet Take 1 mg by mouth 2 (two) times daily.     sertraline (ZOLOFT) 50 MG tablet Take 1 tablet (50 mg total) by mouth daily. 30 tablet 1    Labs  Lab Results:  Admission on 11/11/2021  Component Date Value Ref Range Status   SARS Coronavirus 2 by RT PCR 11/11/2021 NEGATIVE  NEGATIVE Final   Comment: (NOTE) SARS-CoV-2 target  nucleic acids are NOT DETECTED.  The SARS-CoV-2 RNA is generally detectable in upper respiratory specimens during the acute phase of infection. The lowest concentration of SARS-CoV-2 viral copies this assay can detect is 138 copies/mL. A negative result does not preclude SARS-Cov-2 infection and should not be used as the sole basis for treatment or other patient management decisions. A negative result may occur with  improper specimen collection/handling, submission of specimen other than nasopharyngeal swab, presence of viral mutation(s) within the areas targeted by this assay, and inadequate number of viral copies(<138 copies/mL). A negative result must be combined with clinical observations, patient history, and epidemiological information. The expected result is Negative.  Fact Sheet for Patients:  EntrepreneurPulse.com.au  Fact Sheet for Healthcare Providers:  IncredibleEmployment.be  This test is no                          t yet approved or cleared by the [place] FDA and  has been authorized for detection and/or diagnosis of SARS-CoV-2 by FDA under an Emergency Use Authorization (EUA). This EUA will remain  in effect (meaning this test can be used) for the duration of the COVID-19 declaration under Section 564(b)(1) of the Act, 21 U.S.C.section 360bbb-3(b)(1), unless the authorization is terminated  or revoked sooner.       Influenza A by PCR 11/11/2021 NEGATIVE  NEGATIVE Final   Influenza B by PCR 11/11/2021 NEGATIVE  NEGATIVE Final   Comment: (NOTE) The Xpert Xpress SARS-CoV-2/FLU/RSV plus assay is intended as an aid in the diagnosis of influenza from Nasopharyngeal swab specimens  and should not be used as a sole basis for treatment. Nasal washings and aspirates are unacceptable for Xpert Xpress SARS-CoV-2/FLU/RSV testing.  Fact Sheet for Patients: EntrepreneurPulse.com.au  Fact Sheet for Healthcare  Providers: IncredibleEmployment.be  This test is not yet approved or cleared by the [place] FDA and has been authorized for detection and/or diagnosis of SARS-CoV-2 by FDA under an Emergency Use Authorization (EUA). This EUA will remain in effect (meaning this test can be used) for the duration of the COVID-19 declaration under Section 564(b)(1) of the Act, 21 U.S.C. section 360bbb-3(b)(1), unless the authorization is terminated or revoked.  Performed at [facility], [place] [address] [place], [place] [address]    WBC 11/12/2021 5.1  4.0 - 10.5 K/uL Final   RBC 11/12/2021 4.92  4.22 - 5.81 MIL/uL Final   Hemoglobin 11/12/2021 14.4  13.0 - 17.0 g/dL Final   HCT 11/12/2021 43.0  39.0 - 52.0 % Final   MCV 11/12/2021 87.4  80.0 - 100.0 fL Final   MCH 11/12/2021 29.3  26.0 - 34.0 pg Final   MCHC 11/12/2021 33.5  30.0 - 36.0 g/dL Final   RDW 11/12/2021 13.0  11.5 - 15.5 % Final   Platelets 11/12/2021 307  150 - 400 K/uL Final   nRBC 11/12/2021 0.0  0.0 - 0.2 % Final   Neutrophils Relative % 11/12/2021 47  % Final   Neutro Abs 11/12/2021 2.4  1.7 - 7.7 K/uL Final   Lymphocytes Relative 11/12/2021 42  % Final   Lymphs Abs 11/12/2021 2.1  0.7 - 4.0 K/uL Final   Monocytes Relative 11/12/2021 8  % Final   Monocytes Absolute 11/12/2021 0.4  0.1 - 1.0 K/uL Final   Eosinophils Relative 11/12/2021 2  % Final   Eosinophils Absolute 11/12/2021 0.1  0.0 - 0.5 K/uL Final   Basophils Relative 11/12/2021 1  % Final   Basophils Absolute 11/12/2021 0.0  0.0 - 0.1 K/uL Final   Immature Granulocytes 11/12/2021 0  % Final   Abs Immature Granulocytes 11/12/2021 0.01  0.00 - 0.07 K/uL Final   Performed at [facility], [place] [address], [place], [place] [address]   Sodium 11/12/2021 138  135 - 145 mmol/L Final   Potassium 11/12/2021 3.1 (L)  3.5 - 5.1 mmol/L Final   Chloride 11/12/2021 103  98 - 111 mmol/L Final   CO2 11/12/2021 27  22 - 32 mmol/L Final   Glucose, Bld  11/12/2021 122 (H)  70 - 99 mg/dL Final   Glucose reference range applies only to samples taken after fasting for at least 8 hours.   BUN 11/12/2021 14  6 - 20 mg/dL Final   Creatinine, Ser 11/12/2021 0.91  0.61 - 1.24 mg/dL Final   Calcium 11/12/2021 8.6 (L)  8.9 - 10.3 mg/dL Final   Total Protein 11/12/2021 6.2 (L)  6.5 - 8.1 g/dL Final   Albumin 11/12/2021 2.9 (L)  3.5 - 5.0 g/dL Final   AST 11/12/2021 18  15 - 41 U/L Final   ALT 11/12/2021 24  0 - 44 U/L Final   Alkaline Phosphatase 11/12/2021 57  38 - 126 U/L Final   Total Bilirubin 11/12/2021 0.8  0.3 - 1.2 mg/dL Final   GFR, Estimated 11/12/2021 >60  >60 mL/min Final   Comment: (NOTE) Calculated using the CKD-EPI Creatinine Equation (2021)    Anion gap 11/12/2021 8  5 - 15 Final   Performed at [place],  [place] [address]   Hgb A1c MFr Bld 11/12/2021 5.5  4.8 - 5.6 % Final   Comment: (NOTE) Pre diabetes:          5.7%-6.4%  Diabetes:              >6.4%  Glycemic control for   <7.0% adults with diabetes    Mean Plasma Glucose 11/12/2021 111.15  mg/dL Final   Performed at [facility], [place] [address] [place], [place] [address]   Alcohol, Ethyl (B) 11/12/2021 <10  <10 mg/dL Final   Comment: (NOTE) Lowest detectable limit for serum alcohol is 10 mg/dL.  For medical purposes only. Performed at [facility], [place] [address], [place], [place] [address]    TSH 11/12/2021 0.158 (L)  0.350 - 4.500 uIU/mL Final   Comment: Performed by a 3rd Generation assay with a functional sensitivity of <=0.01 uIU/mL. Performed at [facility], [place] [address], [place], [place] [address]    POC Amphetamine UR 11/12/2021 None Detected  NONE DETECTED (Cut Off Level 1000 ng/mL) Final   POC Secobarbital (BAR) 11/12/2021 None Detected  NONE DETECTED (Cut Off Level 300 ng/mL) Final   POC Buprenorphine (BUP) 11/12/2021 None Detected  NONE DETECTED (Cut Off Level 10 ng/mL) Final   POC Oxazepam (BZO)  11/12/2021 None Detected  NONE DETECTED (Cut Off Level 300 ng/mL) Final   POC Cocaine UR 11/12/2021 None Detected  NONE DETECTED (Cut Off Level 300 ng/mL) Final   POC Methamphetamine UR 11/12/2021 None Detected  NONE DETECTED (Cut Off Level 1000 ng/mL) Final   POC Morphine 11/12/2021 None Detected  NONE DETECTED (Cut Off Level 300 ng/mL) Final   POC Methadone UR 11/12/2021 None Detected  NONE DETECTED (Cut Off Level 300 ng/mL) Final   POC Oxycodone UR 11/12/2021 None Detected  NONE DETECTED (Cut Off Level 100 ng/mL) Final   POC Marijuana UR 11/12/2021 Positive (A)  NONE DETECTED (Cut Off Level 50 ng/mL) Final   SARSCOV2ONAVIRUS 2 AG 11/11/2021 NEGATIVE  NEGATIVE Final   Comment: (NOTE) SARS-CoV-2 antigen NOT DETECTED.   Negative results are presumptive.  Negative results do not preclude SARS-CoV-2 infection and should not be used as the sole basis for treatment or other patient management decisions, including infection  control decisions, particularly in the presence of clinical signs and  symptoms consistent with COVID-19, or in those who have been in contact with the virus.  Negative results must be combined with clinical observations, patient history, and epidemiological information. The expected result is Negative.  Fact Sheet for Patients: HandmadeRecipes.com.cy  Fact Sheet for Healthcare Providers: FuneralLife.at  This test is not yet approved or cleared by the [place] FDA and  has been authorized for detection and/or diagnosis of SARS-CoV-2 by FDA under an Emergency Use Authorization (EUA).  This EUA will remain in effect (meaning this test can be used) for the duration of  the COV                          ID-19 declaration under Section 564(b)(1) of the Act, 21 U.S.C. section 360bbb-3(b)(1), unless the authorization is terminated or revoked sooner.     Cholesterol 11/12/2021 147  0 - 200 mg/dL Final   Triglycerides  11/12/2021 81  <150 mg/dL Final   HDL 11/12/2021 31 (L)  >40 mg/dL Final   Total CHOL/HDL Ratio 11/12/2021 4.7  RATIO Final   VLDL 11/12/2021 16  0 - 40 mg/dL Final  LDL Cholesterol 11/12/2021 100 (H)  0 - 99 mg/dL Final   Comment:        Total Cholesterol/HDL:CHD Risk Coronary Heart Disease Risk Table                     Men   Women  1/2 Average Risk   3.4   3.3  Average Risk       5.0   4.4  2 X Average Risk   9.6   7.1  3 X Average Risk  23.4   11.0        Use the calculated Patient Ratio above and the CHD Risk Table to determine the patient's CHD Risk.        ATP III CLASSIFICATION (LDL):  <100     mg/dL   Optimal  100-129  mg/dL   Near or Above                    Optimal  130-159  mg/dL   Borderline  160-189  mg/dL   High  >190     mg/dL   Very High Performed at [place] [address] [place], [place] [address]    Phosphorus 11/12/2021 2.6  2.5 - 4.6 mg/dL Final   Performed at [facility] [address], [place], [place] [address]   Free T4 11/12/2021 1.20 (H)  0.61 - 1.12 ng/dL Final   Comment: (NOTE) Biotin ingestion may interfere with free T4 tests. If the results are inconsistent with the TSH level, previous test results, or the clinical presentation, then consider biotin interference. If needed, order repeat testing after stopping biotin. Performed at [facility], [place] [address] [place], [place] [address]    Magnesium 11/12/2021 2.2  1.7 - 2.4 mg/dL Final   Performed at [facility] [address] [place], [place] [address]   PTH 11/12/2021 20  15 - 65 pg/mL Final   Comment: (NOTE) Performed At: [facility] [place], [place] [id number] Rush Farmer MD [id number]    Vit D, 1,25-Dihydroxy 11/12/2021 57.3  24.8 - 81.5 pg/mL Final   Comment: (NOTE) Performed At: [facility] [place], [place] [id number] Rush Farmer MD [id number]   Admission on 11/04/2021, Discharged  on 11/04/2021  Component Date Value Ref Range Status   Sodium 11/04/2021 140  135 - 145 mmol/L Final   Potassium 11/04/2021 3.0 (L)  3.5 - 5.1 mmol/L Final   Chloride 11/04/2021 103  98 - 111 mmol/L Final   CO2 11/04/2021 29  22 - 32 mmol/L Final   Glucose, Bld 11/04/2021 136 (H)  70 - 99 mg/dL Final   Glucose reference range applies only to samples taken after fasting for at least 8 hours.   BUN 11/04/2021 21 (H)  6 - 20 mg/dL Final   Creatinine, Ser 11/04/2021 1.00  0.61 - 1.24 mg/dL Final   Calcium 11/04/2021 8.3 (L)  8.9 - 10.3 mg/dL Final   GFR, Estimated 11/04/2021 >60  >60 mL/min Final   Comment: (NOTE) Calculated using the CKD-EPI Creatinine Equation (2021)    Anion gap 11/04/2021 8  5 - 15 Final   Performed at [facility], [place] [address] [place], [place] [address]   WBC 11/04/2021 7.8  4.0 - 10.5 K/uL Final   RBC 11/04/2021 4.91  4.22 - 5.81 MIL/uL Final   Hemoglobin 11/04/2021 14.6  13.0 - 17.0 g/dL Final  HCT 11/04/2021 44.2  39.0 - 52.0 % Final   MCV 11/04/2021 90.0  80.0 - 100.0 fL Final   MCH 11/04/2021 29.7  26.0 - 34.0 pg Final   MCHC 11/04/2021 33.0  30.0 - 36.0 g/dL Final   RDW 11/04/2021 13.5  11.5 - 15.5 % Final   Platelets 11/04/2021 207  150 - 400 K/uL Final   nRBC 11/04/2021 0.0  0.0 - 0.2 % Final   Neutrophils Relative % 11/04/2021 77  % Final   Neutro Abs 11/04/2021 6.0  1.7 - 7.7 K/uL Final   Lymphocytes Relative 11/04/2021 13  % Final   Lymphs Abs 11/04/2021 1.0  0.7 - 4.0 K/uL Final   Monocytes Relative 11/04/2021 9  % Final   Monocytes Absolute 11/04/2021 0.7  0.1 - 1.0 K/uL Final   Eosinophils Relative 11/04/2021 1  % Final   Eosinophils Absolute 11/04/2021 0.1  0.0 - 0.5 K/uL Final   Basophils Relative 11/04/2021 0  % Final   Basophils Absolute 11/04/2021 0.0  0.0 - 0.1 K/uL Final   Immature Granulocytes 11/04/2021 0  % Final   Abs Immature Granulocytes 11/04/2021 0.03  0.00 - 0.07 K/uL Final   Performed at [facility], [place] [address], [place], [place] [address]   Troponin I (High Sensitivity) 11/04/2021 8  <18 ng/L Final   Comment: (NOTE) Elevated high sensitivity troponin I (hsTnI) values and significant  changes across serial measurements may suggest ACS but many other  chronic and acute conditions are known to elevate hsTnI results.  Refer to the "Links" section for chest pain algorithms and additional  guidance. Performed at [facility], [place] [address] [place], [place] [address]    Opiates 11/04/2021 NONE DETECTED  NONE DETECTED Final   Cocaine 11/04/2021 POSITIVE (A)  NONE DETECTED Final   Benzodiazepines 11/04/2021 NONE DETECTED  NONE DETECTED Final   Amphetamines 11/04/2021 POSITIVE (A)  NONE DETECTED Final   Tetrahydrocannabinol 11/04/2021 POSITIVE (A)  NONE DETECTED Final   Barbiturates 11/04/2021 NONE DETECTED  NONE DETECTED Final   Comment: (NOTE) DRUG SCREEN FOR MEDICAL PURPOSES ONLY.  IF CONFIRMATION IS NEEDED FOR ANY PURPOSE, NOTIFY LAB WITHIN 5 DAYS.  LOWEST DETECTABLE LIMITS FOR URINE DRUG SCREEN Drug Class                     Cutoff (ng/mL) Amphetamine and metabolites    1000 Barbiturate and metabolites    200 Benzodiazepine                 [id number] Tricyclics and metabolites     300 Opiates and metabolites        300 Cocaine and metabolites        300 THC                            50 Performed at [facility], [place] [address], [place], [place] [address]    Alcohol, Ethyl (B) 11/04/2021 <10  <10 mg/dL Final   Comment: (NOTE) Lowest detectable limit for serum alcohol is 10 mg/dL.  For medical purposes only. Performed at [facility], [place] [address], [place],  [address]     Blood Alcohol level:  Lab Results  Component Value Date   [facility] <10 11/12/2021   ETH <10 [id number]    Metabolic Disorder Labs: Lab Results  Component Value Date   HGBA1C 5.5 11/12/2021   MPG  111.15 11/12/2021    No results found for: "PROLACTIN" Lab Results  Component Value Date   CHOL 147 11/12/2021   TRIG 81 11/12/2021   HDL 31 (L) 11/12/2021   CHOLHDL 4.7 11/12/2021   VLDL 16 11/12/2021   LDLCALC 100 (H) 11/12/2021   LDLCALC 95 07/13/2020    Therapeutic Lab Levels: No results found for: "LITHIUM" No results found for: "VALPROATE" No results found for: "CBMZ"  Physical Findings   GAD-7    Flowsheet Row Office Visit from 07/13/2020 in [facility] Office Visit from 07/10/2020 in [facility]  Total GAD-7 Score 11 18      PHQ2-9    [facility] ED from 11/11/2021 in [facility] Office Visit from 07/13/2020 in [facility] Office Visit from 07/10/2020 in [facility]  PHQ-2 Total Score 1 3 2   PHQ-9 Total Score 6 7 10       [facility] ED from 11/11/2021 in [facility] ED from 11/10/2021 in [facility] ED from 11/04/2021 in [facility] DEPT  C-SSRS RISK CATEGORY No Risk No Risk No Risk        Musculoskeletal  Strength & Muscle Tone: within normal limits Gait & Station: normal Patient leans: N/A  Psychiatric Specialty Exam  Presentation  General Appearance: Appropriate for Environment  Eye Contact:Fair  Speech:Clear and Coherent  Speech Volume:Other (comment) (ASL)  Handedness:Left   Mood and Affect  Mood:Depressed  Affect:Congruent   Thought Process  Thought Processes:Coherent; Goal Directed  Descriptions of Associations:Intact  Orientation:Full (Time, Place and Person)  Thought Content:Logical  Diagnosis of Schizophrenia or Schizoaffective disorder in past: No    Hallucinations:Hallucinations: None  Ideas of Reference:None  Suicidal Thoughts:Suicidal Thoughts: No  Homicidal Thoughts:Homicidal Thoughts: No   Sensorium   Memory:Immediate Fair; Recent Fair; Remote Fair  Judgment:Intact  Insight:Present   Executive Functions  Concentration:Fair  Attention Span:Fair  [place]   Psychomotor Activity  Psychomotor Activity:Psychomotor Activity: Normal   Assets  Assets:Desire for Improvement; Financial Resources/Insurance; Intimacy; Leisure Time; Resilience   Sleep  Sleep:Sleep: Fair   No data recorded  Physical Exam  Physical Exam HENT:     Head: Normocephalic.     Nose: Nose normal.  Eyes:     Conjunctiva/sclera: Conjunctivae normal.  Cardiovascular:     Rate and Rhythm: Normal rate.  Pulmonary:     Effort: Pulmonary effort is normal.  Musculoskeletal:        General: Normal range of motion.     Cervical back: Normal range of motion.  Neurological:     Mental Status: He is alert and oriented to person, place, and time.    Review of Systems  Constitutional: Negative.   HENT: Negative.    Eyes: Negative.   Respiratory: Negative.    Cardiovascular: Negative.   Gastrointestinal: Negative.   Genitourinary: Negative.   Musculoskeletal: Negative.   Skin: Negative.   Neurological: Negative.   Endo/Heme/Allergies: Negative.   Psychiatric/Behavioral:  Positive for depression and substance abuse.    Blood pressure 110/65, pulse 63, temperature 97.7 F (36.5 C), temperature source Temporal, resp. rate 16, SpO2 100 %. There is no height or weight on file to calculate BMI.  Treatment Plan Summary: Daily contact with patient to assess and evaluate symptoms and progress in treatment, Medication management, and Plan Patient admitted to the [facility] for  mood stabilization and substance abuse treatment.   Medications: No medication changes. Continue current medication regimen:   cycloSPORINE  1 drop Both Eyes QHS   multivitamin with minerals  1 tablet Oral Daily   nicotine  14 mg Transdermal Q0600   thiamine  100 mg Oral Daily   Prn  medications:  acetaminophen, alum & mag hydroxide-simeth, doxepin, hydrOXYzine, loperamide, LORazepam, magnesium hydroxide, ondansetron, ramelteon    Dispo-ongoing, - patient is interested in residential substance abuse treatment.   Layla Barter, NP 11/14/2021 9:49 AM

## 2021-11-14 NOTE — ED Notes (Signed)
Went to Pt's room to ask him to come to the desk to have his scheduled morning meds admin, Pt stated he did not want his meds today. Explained to Pt that he had two pills and a nicotine patch due. Pt shook his head and stated again he did not want them. Safety maintained and will continue to monitor.

## 2021-11-15 ENCOUNTER — Encounter (HOSPITAL_COMMUNITY): Payer: Self-pay

## 2021-11-15 NOTE — ED Notes (Signed)
Pt asleep in bed. Respirations even and unlabored. Will continue to monitor for safety. ?

## 2021-11-15 NOTE — ED Notes (Signed)
PHQ-9 filled out and placed in wire basket in chart room.

## 2021-11-15 NOTE — BH IP Treatment Plan (Unsigned)
Interdisciplinary Treatment and Diagnostic Plan Update  11/15/2021 Time of Session: *** Carl Skinner MRN: [id number]  Diagnosis:  Final diagnoses:  Methamphetamine use disorder, severe (HCC)  Substance induced mood disorder (HCC)     Current Medications:  Current Facility-Administered Medications  Medication Dose Route Frequency Provider Last Rate Last Admin   acetaminophen (TYLENOL) tablet 650 mg  650 mg Oral Q6H PRN Augusto Gamble, MD   650 mg at 11/15/21 1114   alum & mag hydroxide-simeth (MAALOX/MYLANTA) 200-200-20 MG/5ML suspension 30 mL  30 mL Oral Q4H PRN Augusto Gamble, MD       cycloSPORINE (RESTASIS) 0.05 % ophthalmic emulsion 1 drop  1 drop Both Eyes QHS Lamptey, Britta Mccreedy, MD       doxepin (SINEQUAN) capsule 10 mg  10 mg Oral QHS PRN Augusto Gamble, MD       magnesium hydroxide (MILK OF MAGNESIA) suspension 30 mL  30 mL Oral Daily PRN Augusto Gamble, MD       ramelteon (ROZEREM) tablet 8 mg  8 mg Oral QHS PRN Augusto Gamble, MD       Current Outpatient Medications  Medication Sig Dispense Refill   cyanocobalamin 1000 MCG tablet Take 1,000 mcg by mouth daily.     ibuprofen (ADVIL) 600 MG tablet Take 1 tablet (600 mg total) by mouth every 6 (six) hours as needed. (Patient taking differently: Take 600 mg by mouth every 6 (six) hours as needed for mild pain.) 30 tablet 0   PTA Medications: Prior to Admission medications   Medication Sig Start Date End Date Taking? Authorizing Provider  cyanocobalamin 1000 MCG tablet Take 1,000 mcg by mouth daily. 10/14/21  Yes [provider]  ibuprofen (ADVIL) 600 MG tablet Take 1 tablet (600 mg total) by mouth every 6 (six) hours as needed. Patient taking differently: Take 600 mg by mouth every 6 (six) hours as needed for mild pain. 11/10/21  Yes Franne Forts, DO    Patient Stressors: {PATIENT STRESSORS:22669}  Patient Strengths: {PATIENT STRENGTHS:22666}  Treatment Modalities: Medication Management, Group therapy, Case management,  1 to  1 session with clinician, Psychoeducation, Recreational therapy.   Physician Treatment Plan for Primary and Secondary Diagnosis:  Final diagnoses:  Methamphetamine use disorder, severe (HCC)  Substance induced mood disorder (HCC)   Long Term Goal(s): Improvement in symptoms so as ready for discharge  Short Term Goals: Patient will verbalize feelings in meetings with treatment team members. Patient will attend at least of 50% of the groups daily. Pt will complete the PHQ9 on admission, day 3 and discharge. Patient will participate in completing the [place] Suicide Severity Rating Scale Patient will score a low risk of violence for 24 hours prior to discharge Patient will take medications as prescribed daily.  Medication Management: Evaluate patient's response, side effects, and tolerance of medication regimen.  Therapeutic Interventions: 1 to 1 sessions, Unit Group sessions and Medication administration.  Evaluation of Outcomes: {BHH Tx Plan Outcomes:30414004}  LCSW Treatment Plan for Primary Diagnosis:  Final diagnoses:  Methamphetamine use disorder, severe (HCC)  Substance induced mood disorder (HCC)    Long Term Goal(s): Safe transition to appropriate next level of care at discharge.  Short Term Goals: {FBC LCSW [place] Plan Short Term Goals:27590}  Therapeutic Interventions: Assess for all discharge needs, 1 to 1 time with Social worker, Explore available resources and support systems, Assess for adequacy in community support network, Educate family and significant other(s) on suicide prevention, Complete Psychosocial Assessment, Interpersonal group therapy.  Evaluation of Outcomes: {  BHH Tx Plan Outcomes:30414004}   Progress in Treatment: Attending groups: {BHH ADULT:22608} Participating in groups: {BHH ADULT:22608} Taking medication as prescribed: {BHH ADULT:22608} Toleration medication: {BHH ADULT:22608} Family/Significant other contact made:  {YES/NO/CONTACT:22665} Patient understands diagnosis: {BHH HBZJI:96789} Discussing patient identified problems/goals with staff: {BHH FYBOF:75102} Medical problems stabilized or resolved: {BHH ADULT:22608} Denies suicidal/homicidal ideation: {BHH ADULT:22608} Issues/concerns per patient self-inventory: {BHH ADULT:22608} Other: ***  New problem(s) identified: {BHH NEW PROBLEMS:22609}  New Short Term/Long Term Goal(s):  Patient Goals:    Discharge Plan or Barriers:   Reason for Continuation of Hospitalization: {BHH Reasons for continued hospitalization:22604}  Estimated Length of Stay:  Last 3 [place] Suicide Severity Risk Score: Flowsheet Row ED from 11/11/2021 in [facility] ED from 11/10/2021 in [facility] ED from 11/04/2021 in [facility]  C-SSRS RISK CATEGORY No Risk No Risk No Risk       Last PHQ 2/9 Scores:    11/13/2021   12:20 PM 11/13/2021   12:10 PM 11/11/2021    4:56 PM  Depression screen PHQ 2/9  Decreased Interest 1 1 1   Down, Depressed, Hopeless 0 2 1  PHQ - 2 Score 1 3 2   Altered sleeping  3 1  Tired, decreased energy  3 1  Change in appetite  1 0  Feeling bad or failure about yourself   1 1  Trouble concentrating  1 1  Moving slowly or fidgety/restless  0 0  Suicidal thoughts  0 0  PHQ-9 Score  12 6  Difficult doing work/chores  Not difficult at all Somewhat difficult    Scribe for Treatment Team: , LCSW 11/15/2021 11:47 AM

## 2021-11-15 NOTE — ED Notes (Signed)
Pt requested to have a 2XL shirt to wear home because his shirt was dirty. Informed Pt that the facility does not allow him to take the scrubs home but we could wash his shirt for him. Pt threw the dry erase marker down onto the desk and said "whatever" and walked into the day room.

## 2021-11-15 NOTE — ED Notes (Signed)
Pt refused to accept scheduled meds x2 days. Informed provider. Meds d/c as a result of refusal. Patient also refused to attend group after agreeing when first informed this morning that a speaker would be coming to the facility. Stated that he could not read lips. Reminded patient that staff was informed that he could read lips very well, patient then stated he did not want to go. Patient remained in bed. Safety maintained and will continue to monitor.

## 2021-11-15 NOTE — ED Notes (Signed)
Patient remains isolative to his room, does not appear motivated for treatment and only gets up for meals and additional food.  Patient states he wants to go to rehab. Patient makes needs known well and will monitor.

## 2021-11-15 NOTE — ED Notes (Signed)
Discharge instructions provided and Pt stated understanding. Pt alert, orient and ambulatory prior to d/c from facility. Personal belongings returned from locker number 17. [employer] called transportation services to patients home. Pt given the opportunity to change from scrub clothes to personal clothing prior to departure from facility. Pt escorted to the front lobby to d/c. Safety maintained.

## 2021-11-15 NOTE — Clinical Social Work Psych Note (Signed)
LCSW Initial Note

## 2021-11-15 NOTE — ED Provider Notes (Signed)
FBC/OBS ASAP Discharge Summary  Date and Time: 11/15/2021 11:27 AM  Name: Carl Skinner  MRN:  [id number]   Discharge Diagnoses:  Final diagnoses:  Methamphetamine use disorder, severe (HCC)    Subjective: Patient seen and assessed at bedside. ASL interpreter was unavailable so all documentation was written to facilitate communication.  Patient reports doing well today.  Denies SI/HI/AVH.  Denies any withdrawal from any substance use.  Discussed ongoing disposition problems regarding substance use treatment facilities and patient currently request that he be discharged to mom's home and will go to cousin's home from there. Taxi has been arranged to allow for direct transportation.  Patient was provided with substance rehab resources and advocacy groups that would be able to better help facilitate patient's healthcare needs.  Patient was amenable to this plan.  Patient refusing all psychotropic and scheduled medications at this time.  Stay Summary by Problem List Carl Skinner is a 50 y.o.male with a history of impaired hearing, stimulant use disorders-methamphetamine and substance-induced mood disorder admitted to [facility] for residential substance rehab placement.  Methamphetamine use disorder Substance induced mood disorder Patient appears contemplative regarding ceasing substance use.  Encourage patient to cease substance use and work towards obtaining an insurance that would cover residential substance rehab treatment so that he will have more healthcare access given his hearing impairment limiting his options.  Unfortunately, there were no available residential substance rehab treatments that were available during his stay at [facility] beyond ones that patient's Medicare would not cover for.  Patient was understanding of plan and stated he would work towards obtaining such insurance.  Denies SI/HI/AVH.  Does not appear to be acutely withdrawing from any substance use.  Hearing Impairment Patient was  provided with hearing impairment resources to better facilitate patient's needs.   Total Time spent with patient: 45 minutes  Past Psychiatric History: Stimulant use disorder, substance-induced mood disorder Past Medical History:  Past Medical History:  Diagnosis Date   Anxiety    Deaf    Depression    Substance abuse (HCC)     Past Surgical History:  Procedure Laterality Date   BICEPT TENODESIS Left 06/20/2019   Procedure: BICEPS TENODESIS;  Surgeon: Bjorn Pippin, MD;  Location: [facility];  Service: Orthopedics;  Laterality: Left;   NOSE SURGERY     ROTATOR CUFF REPAIR Left    Family History:  Family History  Problem Relation Age of Onset   Healthy Mother    Drug abuse Mother    Healthy Father    Drug abuse Sister    Drug abuse Maternal Uncle    Family Psychiatric History: none Social History:  Social History   Substance and Sexual Activity  Alcohol Use Not Currently     Social History   Substance and Sexual Activity  Drug Use Yes   Types: Methamphetamines   Comment: "currently high on meth" - states used this AM    Social History   Socioeconomic History   Marital status: Unknown    Spouse name: Not on file   Number of children: Not on file   Years of education: Not on file   Highest education level: Not on file  Occupational History   Not on file  Tobacco Use   Smoking status: Every Day    Packs/day: 0.50    Types: Cigarettes   Smokeless tobacco: Never  Substance and Sexual Activity   Alcohol use: Not Currently   Drug use: Yes    Types: Methamphetamines  Comment: "currently high on meth" - states used this AM   Sexual activity: Not on file  Other Topics Concern   Not on file  Social History Narrative   Not on file   Social Determinants of Health   Financial Resource Strain: Not on file  Food Insecurity: Not on file  Transportation Needs: Not on file  Physical Activity: Not on file  Stress: Not on file  Social Connections:  Not on file   SDOH:  SDOH Screenings   Alcohol Screen: Not on file  Depression (PHQ2-9): Low Risk  (11/13/2021)   Depression (PHQ2-9)    PHQ-2 Score: 1  Recent Concern: Depression (PHQ2-9) - Medium Risk (11/11/2021)   Depression (PHQ2-9)    PHQ-2 Score: 6  Financial Resource Strain: Not on file  Food Insecurity: Not on file  Housing: Not on file  Physical Activity: Not on file  Social Connections: Not on file  Stress: Not on file  Tobacco Use: High Risk (11/13/2021)   Patient History    Smoking Tobacco Use: Every Day    Smokeless Tobacco Use: Never    Passive Exposure: Not on file  Transportation Needs: Not on file    Tobacco Cessation:  N/A, patient does not currently use tobacco products  Current Medications:  Current Facility-Administered Medications  Medication Dose Route Frequency Provider Last Rate Last Admin   acetaminophen (TYLENOL) tablet 650 mg  650 mg Oral Q6H PRN Augusto Gamble, MD   650 mg at 11/15/21 1114   alum & mag hydroxide-simeth (MAALOX/MYLANTA) 200-200-20 MG/5ML suspension 30 mL  30 mL Oral Q4H PRN Augusto Gamble, MD       cycloSPORINE (RESTASIS) 0.05 % ophthalmic emulsion 1 drop  1 drop Both Eyes QHS Lamptey, Britta Mccreedy, MD       doxepin (SINEQUAN) capsule 10 mg  10 mg Oral QHS PRN Augusto Gamble, MD       magnesium hydroxide (MILK OF MAGNESIA) suspension 30 mL  30 mL Oral Daily PRN Augusto Gamble, MD       ramelteon (ROZEREM) tablet 8 mg  8 mg Oral QHS PRN Augusto Gamble, MD       Current Outpatient Medications  Medication Sig Dispense Refill   atomoxetine (STRATTERA) 40 MG capsule Take 40 mg by mouth daily.     cyanocobalamin 1000 MCG tablet Take 1,000 mcg by mouth daily.     DULoxetine (CYMBALTA) 30 MG capsule Take 30 mg by mouth daily.     ibuprofen (ADVIL) 600 MG tablet Take 1 tablet (600 mg total) by mouth every 6 (six) hours as needed. (Patient taking differently: Take 600 mg by mouth every 6 (six) hours as needed for mild pain.) 30 tablet 0   LORazepam (ATIVAN) 1  MG tablet Take 1 mg by mouth 2 (two) times daily.     sertraline (ZOLOFT) 50 MG tablet Take 1 tablet (50 mg total) by mouth daily. 30 tablet 1    PTA Medications: (Not in a hospital admission)       11/13/2021   12:20 PM 11/13/2021   12:10 PM 11/11/2021    4:56 PM  Depression screen PHQ 2/9  Decreased Interest 1 1 1   Down, Depressed, Hopeless 0 2 1  PHQ - 2 Score 1 3 2   Altered sleeping  3 1  Tired, decreased energy  3 1  Change in appetite  1 0  Feeling bad or failure about yourself   1 1  Trouble concentrating  1 1  Moving slowly or  fidgety/restless  0 0  Suicidal thoughts  0 0  PHQ-9 Score  12 6  Difficult doing work/chores  Not difficult at all Somewhat difficult    Flowsheet Row ED from 11/11/2021 in [facility] ED from 11/10/2021 in [facility] ED from 11/04/2021 in [facility]  C-SSRS RISK CATEGORY No Risk No Risk No Risk       Musculoskeletal  Strength & Muscle Tone: within normal limits Gait & Station: normal Patient leans: N/A  Psychiatric Specialty Exam  Presentation  General Appearance: Appropriate for Environment   Eye Contact:Fair   Speech:Clear and Coherent   Speech Volume:Other (comment) (ASL)   Handedness:Left    Mood and Affect  Mood:Depressed   Affect:Congruent    Thought Process  Thought Processes:Coherent; Goal Directed   Descriptions of Associations:Intact   Orientation:Full (Time, Place and Person)   Thought Content:Logical      Hallucinations:Hallucinations: None   Ideas of Reference:None   Suicidal Thoughts:Suicidal Thoughts: No   Homicidal Thoughts:Homicidal Thoughts: No    Sensorium  Memory:Immediate Fair; Recent Fair; Remote Fair   Judgment:Intact   Insight:Present    Executive Functions  Concentration:Fair   Attention Span:Fair   Recall:Fair   Fund of  Knowledge:Fair   Language:Fair    Psychomotor Activity  Psychomotor Activity:Psychomotor Activity: Normal    Assets  Assets:Desire for Improvement; Financial Resources/Insurance; Intimacy; Leisure Time; Resilience    Sleep  Sleep:Sleep: Fair    No data recorded   Physical Exam  Physical Exam Vitals and nursing note reviewed.  Constitutional:      General: He is not in acute distress.    Appearance: He is well-developed.  HENT:     Head: Normocephalic and atraumatic.  Eyes:     Conjunctiva/sclera: Conjunctivae normal.  Cardiovascular:     Rate and Rhythm: Normal rate and regular rhythm.     Heart sounds: No murmur heard. Pulmonary:     Effort: Pulmonary effort is normal. No respiratory distress.     Breath sounds: Normal breath sounds.  Abdominal:     Palpations: Abdomen is soft.     Tenderness: There is no abdominal tenderness.  Musculoskeletal:        General: No swelling.     Cervical back: Neck supple.  Skin:    General: Skin is warm and dry.     Capillary Refill: Capillary refill takes less than 2 seconds.  Neurological:     Mental Status: He is alert.  Psychiatric:        Mood and Affect: Mood normal.    Review of Systems  Respiratory:  Negative for shortness of breath.   Cardiovascular:  Negative for chest pain.  Gastrointestinal:  Negative for abdominal pain, constipation, diarrhea, heartburn, nausea and vomiting.  Neurological:  Negative for headaches.   Blood pressure 121/75, pulse 62, temperature 97.9 F (36.6 C), temperature source Oral, resp. rate 18, SpO2 100 %. There is no height or weight on file to calculate BMI.  Demographic Factors:  Male, Low socioeconomic status, and Unemployed  Loss Factors: Financial problems/change in socioeconomic status  Historical Factors: Impulsivity  Risk Reduction Factors:   Living with another person, especially a relative and Positive social support  Continued Clinical Symptoms:   Alcohol/Substance Abuse/Dependencies  Cognitive Features That Contribute To Risk:  None    Suicide Risk:  Mild:  Suicidal ideation of limited frequency, intensity, duration, and specificity.  There are no identifiable plans, no associated  intent, mild dysphoria and related symptoms, good self-control (both objective and subjective assessment), few other risk factors, and identifiable protective factors, including available and accessible social support.  Plan Of Care/Follow-up recommendations:   Follow-up recommendations:   Activity:  as tolerated Diet:  heart healthy   Comments:  Prescriptions were given at discharge.  Patient is agreeable with the discharge plan.  Patient was given an opportunity to ask questions.  Patient appears to feel comfortable with discharge and denies any current suicidal or homicidal thoughts.    Patient was told to: Report any adverse effects and or reactions from the medicines to outpatient provider promptly. In the event of worsening symptoms, patient is instructed to call the crisis hotline, 911 and or go to the nearest ED for appropriate evaluation and treatment of symptoms. Patient is to follow-up with primary care provider for other medical issues, concerns and or health care needs.   Park Pope, MD 11/15/2021, 11:27 AM

## 2022-03-18 IMAGING — DX DG FINGER MIDDLE 2+V*L*
3 series · 3 of 3 positions shown · non-contrast
Comparison: None.

CLINICAL DATA: Trauma

EXAM:
LEFT MIDDLE FINGER 2+V

[finger ap]
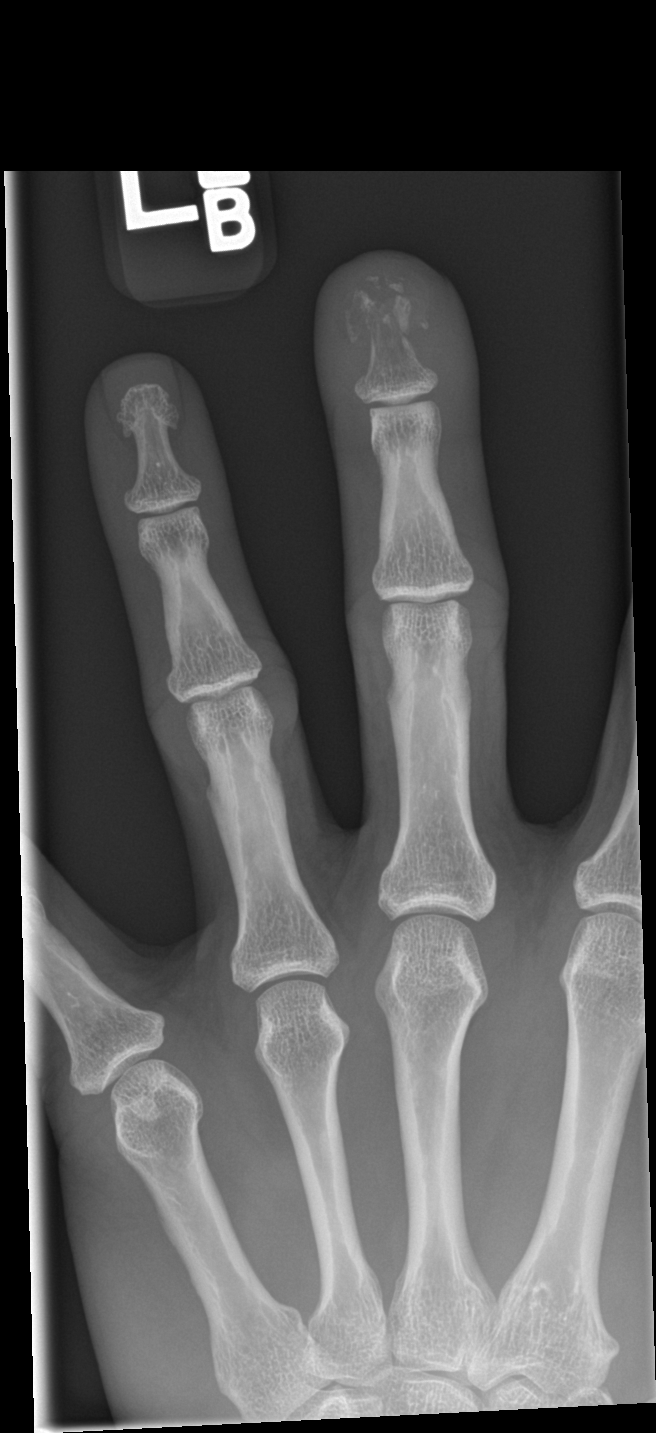

[finger obl]
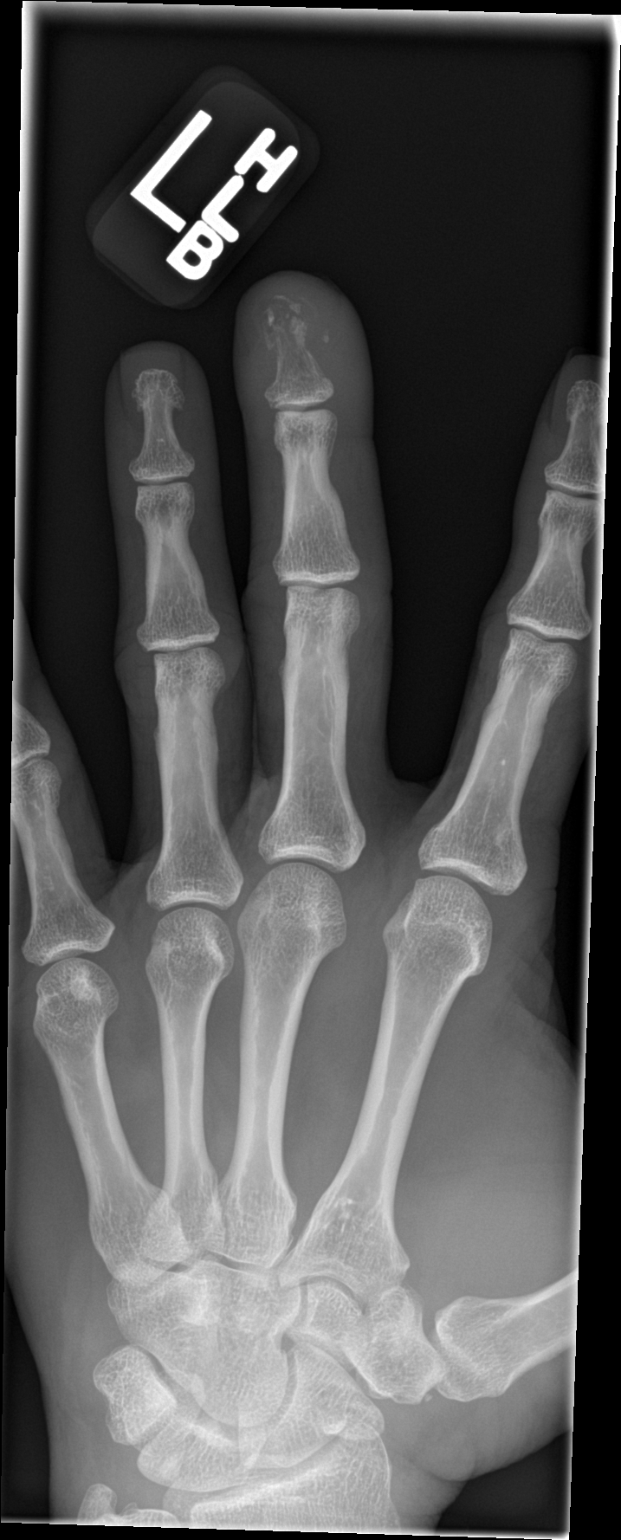

[finger lat]
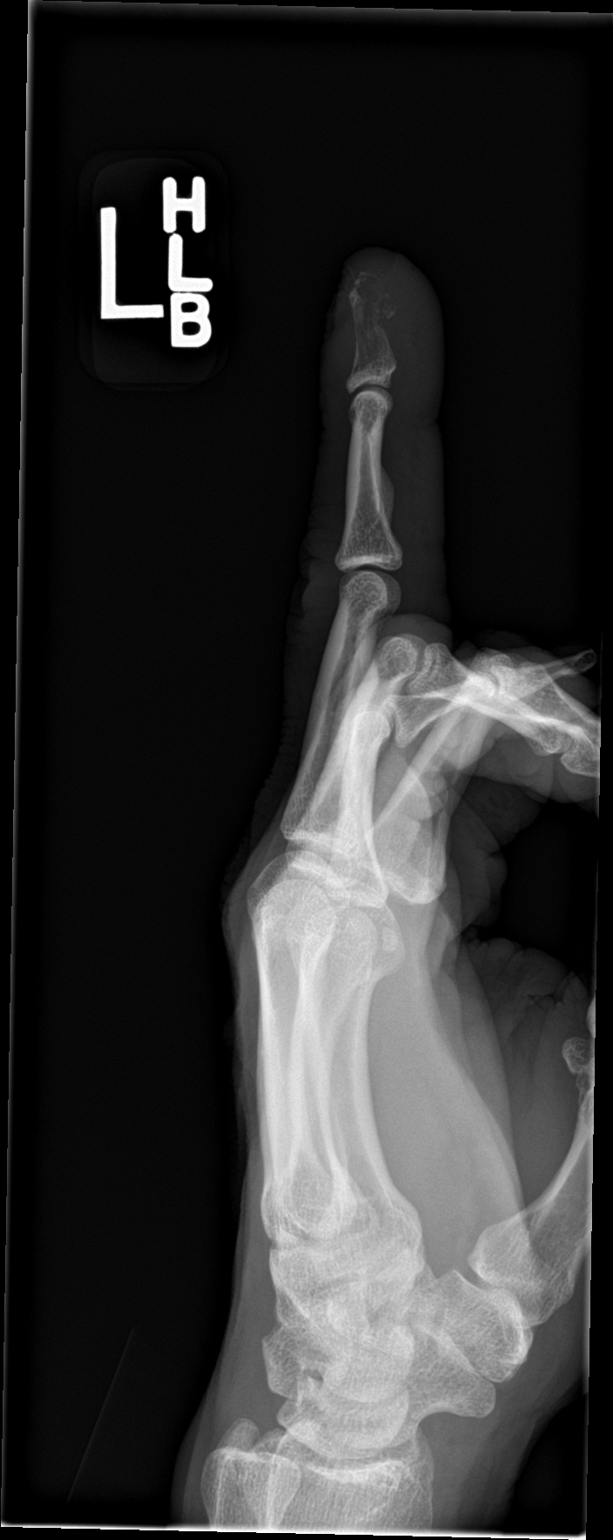

[3 of 3 positions shown; findings below may reference images not displayed]

FINDINGS: Fragmentation and loss of density of the tuft of the distal phalanx
middle digit. Presumed comminuted fracture. Soft tissue swelling. No
subcutaneous gas.
IMPRESSION: Presumed comminuted fracture of the distal phalanx tuft versus
osteomyelitis. Favor fracture.
